# Patient Record
Sex: Female | Born: 1990 | Marital: Single | Smoking: Never smoker
Health system: Southern US, Community
[De-identification: ages and names within clinical notes are randomized; demographics above are authoritative.]

## PROBLEM LIST (undated history)

## (undated) DIAGNOSIS — H9319 Tinnitus, unspecified ear: Secondary | ICD-10-CM

## (undated) DIAGNOSIS — H539 Unspecified visual disturbance: Secondary | ICD-10-CM

## (undated) DIAGNOSIS — S060X9A Concussion with loss of consciousness of unspecified duration, initial encounter: Secondary | ICD-10-CM

## (undated) DIAGNOSIS — S060XAA Concussion with loss of consciousness status unknown, initial encounter: Secondary | ICD-10-CM

## (undated) DIAGNOSIS — R519 Headache, unspecified: Secondary | ICD-10-CM

## (undated) DIAGNOSIS — R569 Unspecified convulsions: Secondary | ICD-10-CM

## (undated) HISTORY — DX: Tinnitus, unspecified ear: H93.19

## (undated) HISTORY — DX: Concussion with loss of consciousness status unknown, initial encounter: S06.0XAA

## (undated) HISTORY — DX: Unspecified visual disturbance: H53.9

## (undated) HISTORY — DX: Unspecified convulsions: R56.9

## (undated) HISTORY — DX: Concussion with loss of consciousness of unspecified duration, initial encounter: S06.0X9A

## (undated) HISTORY — DX: Headache, unspecified: R51.9

---

## 2018-12-24 ENCOUNTER — Ambulatory Visit (HOSPITAL_COMMUNITY): Payer: Self-pay | Admitting: Psychiatry

## 2019-03-09 ENCOUNTER — Encounter: Payer: Self-pay | Admitting: Neurology

## 2019-03-09 ENCOUNTER — Ambulatory Visit: Payer: Medicaid Other | Admitting: Neurology

## 2019-03-09 ENCOUNTER — Other Ambulatory Visit: Payer: Self-pay

## 2019-03-09 VITALS — BP 119/83 | HR 96 | Temp 97.7°F | Ht 63.0 in | Wt 186.0 lb

## 2019-03-09 DIAGNOSIS — G43019 Migraine without aura, intractable, without status migrainosus: Secondary | ICD-10-CM

## 2019-03-09 MED ORDER — AIMOVIG 140 MG/ML ~~LOC~~ SOAJ: 140.0000 mg | SUBCUTANEOUS | 5 refills | Status: DC

## 2019-03-09 MED ORDER — ONDANSETRON HCL 4 MG PO TABS: 4.0000 mg | ORAL_TABLET | Freq: Three times a day (TID) | ORAL | 0 refills | Status: DC | PRN

## 2019-03-09 NOTE — Patient Instructions (Addendum)
You have tried a failed multiple preventative and acute treatments for your migraines.  You are a good candidate for one of the newer injectable medications, such as Aimovig, Ajovy and Emgality.  As discussed, we will start Aimovig 140 mg/ml, 1 inj subcutaneously every 30 days.  We can help you with your first injection and education. Please remember that Aimovig has not been approved for use during pregnancy in case you are planning to get pregnant.  You have to stop the medication in that case.   Potential side effects include, But are not limited to constipation, injection site reaction, muscle spasm or muscle cramps, please feel free to read the package insert. If need be, we can help you with your first injection once it is available, to teach you how to do the monthly injections.  For nausea, I recommend a short prescription for Zofran/ondansetron 4 mg strength as needed.  For your ringing in the ear and spinning sensation/vertigo symptoms, please follow-up with your ENT and/or talk to Raelyn Number, your primary nurse practitioner about seeing an ear nose throat specialist and/or doing physical therapy for vertigo, if you have not had it.  Please follow-up in 3 months to see one of our nurse practitioners, either [place] or [place].
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
## 2019-03-23 ENCOUNTER — Ambulatory Visit: Payer: Medicaid Other | Admitting: Adult Health

## 2019-04-13 ENCOUNTER — Ambulatory Visit: Payer: Medicaid Other | Admitting: Adult Health

## 2019-04-13 ENCOUNTER — Ambulatory Visit (INDEPENDENT_AMBULATORY_CARE_PROVIDER_SITE_OTHER): Payer: Self-pay | Admitting: Neurology

## 2019-04-13 DIAGNOSIS — G43019 Migraine without aura, intractable, without status migrainosus: Secondary | ICD-10-CM

## 2019-04-14 NOTE — Progress Notes (Signed)
Pt presented today and I educated her on how to administer her aimovig injection. We talked about making sure to keep the medication in refrigerator until ready to take. Discussed letting it sit for 30-45 min before administering. Reviewed the different locations on where to give injection. Discussed about cleaning the site and how to prepare the injection. Educated the patient on how to hold the injection pen so that it was easy to press the button and educated on making sure that the pen is pressed firmly against the skin. Patient was able to administer her personal medication into her RLQ. She was appreciative for the education. A Card was given as a reference for future injections. Informed the patient this is due monthly and she can set a reminder on her phone to make sure she gives 2nd shot 05/14/19. She verbalized understanding.

## 2019-06-01 ENCOUNTER — Telehealth: Payer: Self-pay | Admitting: Neurology

## 2019-06-01 NOTE — Telephone Encounter (Signed)
Pt states when she was here she was told she would be referred to [facility] for Vertigo therapy.  Pt states she has never heard from them and they told her they have not received anything on her for therapy.  Please call

## 2019-06-01 NOTE — Telephone Encounter (Signed)
Done

## 2019-06-01 NOTE — Telephone Encounter (Signed)
Per Dr. Teofilo Pod note: "For your ringing in the ear and spinning sensation/vertigo symptoms, please follow-up with your ENT and/or talk to Norva Riffle, your primary nurse practitioner about seeing an ear nose throat specialist and/or doing physical therapy for vertigo, if you have not had it."   I called pt and had an extended conversation with her. I reminded her to speak with her PCP and/or ENT regarding her vertigo symptoms. She is wondering why she is still nauseated at times. She doesn't think zofran helps. She has an appt on Monday and will discuss this with Aundra Millet, NP at that time.  She reports that she does not want [facility] and [facility] communicating about her medical history and that she "left [facility] for a reason." She wants to be listed as a "private" patient. She is concerned about a possible data breach. I advised her that I will ask our MR coordinator to look into this.   Pt verbalized understanding and agreement of the above plan.

## 2019-06-07 ENCOUNTER — Telehealth: Payer: Self-pay | Admitting: Adult Health

## 2019-06-07 ENCOUNTER — Encounter: Payer: Self-pay | Admitting: Adult Health

## 2019-06-07 ENCOUNTER — Ambulatory Visit: Payer: Medicaid Other | Admitting: Adult Health

## 2019-06-07 VITALS — BP 122/84 | HR 84 | Temp 97.8°F | Ht 62.0 in | Wt 188.8 lb

## 2019-06-07 DIAGNOSIS — R2689 Other abnormalities of gait and mobility: Secondary | ICD-10-CM

## 2019-06-07 DIAGNOSIS — R519 Headache, unspecified: Secondary | ICD-10-CM | POA: Diagnosis not present

## 2019-06-07 DIAGNOSIS — R42 Dizziness and giddiness: Secondary | ICD-10-CM | POA: Diagnosis not present

## 2019-06-07 NOTE — Patient Instructions (Addendum)
Your Plan:  Continue aimovig  Discuss medications with psychiatrist could be medication interaction? MRI brain Blood work and drug screen today If your symptoms worsen or you develop new symptoms please let us know.     Thank you for coming to see [place] at [facility]. I hope we have been able to provide you high quality care today.  You may receive a patient satisfaction survey over the next few weeks. We would appreciate your feedback and comments so that we may continue to improve ourselves and the health of our patients.
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
## 2019-06-07 NOTE — Telephone Encounter (Signed)
Medicaid order sent to GI. They will obtain the auth and reach out to the patient to schedule.  

## 2019-06-10 LAB — DRUG PROFILE 799016
Amphetamine GC/MS Conf: 2202 ng/mL
Amphetamine: POSITIVE — AB
Amphetamines: POSITIVE — AB
Methamphetamine: NEGATIVE

## 2019-06-10 LAB — DRUG SCREEN 764883 11+OXYCO+ALC+CRT-BUND
BENZODIAZ UR QL: NEGATIVE ng/mL
Barbiturate: NEGATIVE ng/mL
Cannabinoid Quant, Ur: NEGATIVE ng/mL
Cocaine (Metabolite): NEGATIVE ng/mL
Creatinine: 247.6 mg/dL (ref 20.0–300.0)
Ethanol: NEGATIVE %
Meperidine: NEGATIVE ng/mL
Methadone Screen, Urine: NEGATIVE ng/mL
OPIATE SCREEN URINE: NEGATIVE ng/mL
Oxycodone/Oxymorphone, Urine: NEGATIVE ng/mL
Phencyclidine: NEGATIVE ng/mL
Propoxyphene: NEGATIVE ng/mL
Tramadol: NEGATIVE ng/mL
pH, Urine: 5.8 (ref 4.5–8.9)

## 2019-06-10 LAB — ETHANOL: Ethanol: <0.01 %

## 2019-06-14 ENCOUNTER — Other Ambulatory Visit: Payer: Self-pay | Admitting: Neurology

## 2019-06-16 ENCOUNTER — Telehealth: Payer: Self-pay | Admitting: Adult Health

## 2019-06-16 NOTE — Telephone Encounter (Signed)
Drug registry check for pt. Only medication listed was 07/06/2017 1 07/04/2017 Vyvanse 30 Mg Capsule.

## 2019-06-16 NOTE — Telephone Encounter (Signed)
I called the patient.  Advised that her drug screen was positive for amphetamines.  She reports that she was given Adderall by [facility].  She reports that she is currently out of this prescription and has requested a refill.  The patient also has not scheduled her MRI.  I gave her [facility] images number to call and schedule.

## 2019-06-22 ENCOUNTER — Telehealth: Payer: Self-pay | Admitting: Adult Health

## 2019-06-22 DIAGNOSIS — R42 Dizziness and giddiness: Secondary | ICD-10-CM

## 2019-06-22 NOTE — Telephone Encounter (Signed)
Pt called wanting to get an update on her vertigo therapy referral states she has not received a call. Please follow up

## 2019-06-23 NOTE — Addendum Note (Signed)
Addended by: Guy Begin on: 06/23/2019 08:33 AM   Modules accepted: Orders

## 2019-06-29 NOTE — Telephone Encounter (Signed)
Pt states she has called [facility] and has been told they do not have the referral yet.  Pt is asking to be called @[phone] , pt states her vertigo is worsening.  Please call

## 2019-06-30 NOTE — Addendum Note (Signed)
Addended by: Enedina Finner on: 06/30/2019 11:34 AM   Modules accepted: Orders

## 2019-07-08 ENCOUNTER — Ambulatory Visit: Payer: Medicaid Other | Attending: Adult Health

## 2019-07-08 ENCOUNTER — Other Ambulatory Visit: Payer: Self-pay

## 2019-07-08 DIAGNOSIS — R2681 Unsteadiness on feet: Secondary | ICD-10-CM

## 2019-07-08 DIAGNOSIS — R42 Dizziness and giddiness: Secondary | ICD-10-CM | POA: Diagnosis not present

## 2019-07-08 DIAGNOSIS — R2689 Other abnormalities of gait and mobility: Secondary | ICD-10-CM | POA: Diagnosis present
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
## 2019-07-12 ENCOUNTER — Other Ambulatory Visit: Payer: Self-pay | Admitting: Physician Assistant

## 2019-07-12 DIAGNOSIS — R112 Nausea with vomiting, unspecified: Secondary | ICD-10-CM

## 2019-07-14 ENCOUNTER — Ambulatory Visit
Admission: RE | Admit: 2019-07-14 | Discharge: 2019-07-14 | Disposition: A | Discharge status: Home / Self Care | Payer: Medicaid Other | Source: Ambulatory Visit | Attending: Adult Health | Admitting: Adult Health

## 2019-07-14 ENCOUNTER — Other Ambulatory Visit: Payer: Self-pay

## 2019-07-14 DIAGNOSIS — R519 Headache, unspecified: Secondary | ICD-10-CM

## 2019-07-14 DIAGNOSIS — R42 Dizziness and giddiness: Secondary | ICD-10-CM

## 2019-07-14 DIAGNOSIS — R2689 Other abnormalities of gait and mobility: Secondary | ICD-10-CM

## 2019-07-14 MED ORDER — GADOBENATE DIMEGLUMINE 529 MG/ML IV SOLN
19.0000 mL | Freq: Once | INTRAVENOUS | Status: AC | PRN
  Administered 2019-07-14: 19 mL via INTRAVENOUS

## 2019-07-15 ENCOUNTER — Ambulatory Visit: Payer: Medicaid Other | Admitting: Physical Therapy

## 2019-07-15 ENCOUNTER — Telehealth: Payer: Self-pay

## 2019-07-15 NOTE — Telephone Encounter (Signed)
-----   Message from Geronimo Running, RN sent at 07/15/2019  4:02 PM EDT -----  ----- Message ----- From: Butch Penny, NP Sent: 07/15/2019   3:58 PM EDT To: Guy Begin, RN  MRI is unremarkable.

## 2019-07-15 NOTE — Telephone Encounter (Signed)
Lm on the VM for the patient to call back °

## 2019-07-20 ENCOUNTER — Ambulatory Visit: Payer: Medicaid Other | Attending: Adult Health | Admitting: Physical Therapy

## 2019-07-20 ENCOUNTER — Ambulatory Visit
Admission: RE | Admit: 2019-07-20 | Discharge: 2019-07-20 | Disposition: A | Discharge status: Home / Self Care | Payer: Medicaid Other | Source: Ambulatory Visit | Attending: Physician Assistant | Admitting: Physician Assistant

## 2019-07-20 DIAGNOSIS — R112 Nausea with vomiting, unspecified: Secondary | ICD-10-CM

## 2019-07-28 ENCOUNTER — Ambulatory Visit: Payer: Medicaid Other | Admitting: Physical Therapy

## 2019-08-02 NOTE — Telephone Encounter (Signed)
Another attempt to contact the patient was unsuccessful. Phone number was disconnected

## 2019-08-02 NOTE — Telephone Encounter (Signed)
-----   Message from Kristen Dinkins, RN sent at 07/15/2019  4:02 PM EDT -----  ----- Message ----- From: Millikan, Megan, NP Sent: 07/15/2019   3:58 PM EDT To: Sandra S Young, RN  MRI is unremarkable.  

## 2019-08-12 ENCOUNTER — Telehealth: Payer: Self-pay | Admitting: Adult Health

## 2019-08-12 DIAGNOSIS — G43019 Migraine without aura, intractable, without status migrainosus: Secondary | ICD-10-CM

## 2019-08-12 DIAGNOSIS — R2689 Other abnormalities of gait and mobility: Secondary | ICD-10-CM

## 2019-08-12 DIAGNOSIS — R42 Dizziness and giddiness: Secondary | ICD-10-CM

## 2019-08-12 NOTE — Telephone Encounter (Signed)
Pt called to advise if she could begin botox for her migraines as she is still having issues [phone]

## 2019-08-12 NOTE — Telephone Encounter (Signed)
I called and could not LVM.

## 2019-08-17 NOTE — Addendum Note (Signed)
Addended by: Hermenia Fiscal S on: 08/17/2019 11:45 AM   Modules accepted: Orders

## 2019-08-17 NOTE — Telephone Encounter (Signed)
I spoke to pt She is asking about BOTOX.  I made appt 09-21-19 at 1130.  She asked about vest rehab and I relayed that she should be able to call and setup again.  (looking at schedule she has had no shows multiple times).

## 2019-08-17 NOTE — Telephone Encounter (Signed)
I spoke to Jacqueline Bishop re: to vest rehab.  She recommend to place another order for her.  Done.

## 2019-08-20 ENCOUNTER — Telehealth: Payer: Self-pay | Admitting: Adult Health

## 2019-08-20 NOTE — Telephone Encounter (Signed)
Pt has called asking that a prior authorization request be sent to [facility] for additional office visits.  Please call

## 2019-08-27 NOTE — Telephone Encounter (Signed)
Pt called wanting to know the update on the PA. Please advise.

## 2019-08-30 ENCOUNTER — Ambulatory Visit: Payer: Medicaid Other

## 2019-08-30 NOTE — Telephone Encounter (Signed)
Patient called me and rescheduled her appointment for July per Angie in billing. I could not find any openings with Megan, so I scheduled her with Dr. Frances Furbish for 7/13 at 8:30, check in 8.

## 2019-08-30 NOTE — Telephone Encounter (Signed)
I called pt and after speaking to her about needing a PA for extending visit (surpassing # of visits in the year).  I relayed per AA in billing that this si not something they have done before.  She can get a form that can be filled out.  She states that she wanted to be transferred to billing.  I did to AA [id number].

## 2019-08-31 ENCOUNTER — Other Ambulatory Visit: Payer: Self-pay | Admitting: Physician Assistant

## 2019-08-31 DIAGNOSIS — R112 Nausea with vomiting, unspecified: Secondary | ICD-10-CM

## 2019-09-06 ENCOUNTER — Other Ambulatory Visit: Payer: Self-pay

## 2019-09-06 ENCOUNTER — Telehealth: Payer: Self-pay | Admitting: Adult Health

## 2019-09-06 ENCOUNTER — Encounter: Payer: Self-pay | Admitting: Physical Therapy

## 2019-09-06 ENCOUNTER — Ambulatory Visit: Payer: Medicaid Other | Attending: Adult Health | Admitting: Physical Therapy

## 2019-09-06 DIAGNOSIS — R42 Dizziness and giddiness: Secondary | ICD-10-CM | POA: Insufficient documentation

## 2019-09-06 DIAGNOSIS — R2681 Unsteadiness on feet: Secondary | ICD-10-CM | POA: Insufficient documentation

## 2019-09-06 DIAGNOSIS — R2689 Other abnormalities of gait and mobility: Secondary | ICD-10-CM | POA: Diagnosis present

## 2019-09-06 NOTE — Patient Instructions (Signed)
Gaze Stabilization: Sitting    Keeping eyes on target on wall \\_10N  feet away, tilt head down 15-30 and move head side to side for _30___ seconds. Repeat while moving head up and down for _30___ seconds. Do _3___ sessions per day.    Gaze Stabilization: Tip Card  1.Target must remain in focus, not blurry, and appear stationary while head is in motion. 2.Perform exercises with small head movements (45 to either side of midline). 3.Increase speed of head motion so long as target is in focus. 4.If you wear eyeglasses, be sure you can see target through lens (therapist will give specific instructions for bifocal / progressive lenses). 5.These exercises may provoke dizziness or nausea. Work through these symptoms. If too dizzy, slow head movement slightly. Rest between each exercise. 6.Exercises demand concentration; avoid distractions. 7.For safety, perform standing exercises close to a counter, wall, corner, or next to someone.  Copyright  [employer]. All rights reserved.

## 2019-09-06 NOTE — Telephone Encounter (Signed)
Pt is requesting call back from nurse regarding questions about a new migraine medication. She has a follow-up with Dr. Frances Furbish on 07/13 but is wanting to see if she can be given something before then. Please advise at [phone].

## 2019-09-06 NOTE — Telephone Encounter (Signed)
Called and VM not set up

## 2019-09-07 NOTE — Therapy (Signed)
[facility] [address] [place], [place], [address] Phone: [phone]   Fax:  [phone]  Physical Therapy Evaluation  Patient Details  Name: Jacqueline Bishop MRN: [id number] Date of Birth: 07/17/1990 Referring Provider (PT): Butch Penny, NP   Encounter Date: 09/06/2019   PT End of Session - 09/07/19 2058    Visit Number 1    Number of Visits 6    Authorization Type CCME    PT Start Time 0932    PT Stop Time 1016    PT Time Calculation (min) 44 min    Activity Tolerance Patient limited by pain;Other (comment)   limited by dizziness   Behavior During Therapy Restless;Anxious           Past Medical History:  Diagnosis Date  . Concussion   . Headache    migraines  . Seizures (HCC)    as child  . Tinnitus   . Vision abnormalities    blurred vision r/t migraines    History reviewed. No pertinent surgical history.  There were no vitals filed for this visit.        [facility] PT Assessment - 09/07/19 0001      Assessment   Medical Diagnosis Dizziness    Referring Provider (PT) Butch Penny, NP    Onset Date/Surgical Date 06/30/19   referral date but dizziness began a few years ago   Hand Dominance Right    Prior Therapy none for dizziness      Precautions   Precautions Fall      Restrictions   Weight Bearing Restrictions No      Balance Screen   Has the patient fallen in the past 6 months Yes    How many times? 30+    Has the patient had a decrease in activity level because of a fear of falling?  Yes    Is the patient reluctant to leave their home because of a fear of falling?  Yes      Home Environment   Living Environment Private residence    Living Arrangements Parent   mom   Available Help at Discharge Family;Available PRN/intermittently    Type of Home House    Home Access Stairs to enter    Entrance Stairs-Number of Steps 3    Entrance Stairs-Rails Left    Home Layout One level     Home Equipment Crutches      Prior Function   Level of Independence Independent    Vocation On disability    Leisure Watch movies, read, look at stars,       Cognition   Overall Cognitive Status Within Functional Limits for tasks assessed   pt reports issues with memory     Ambulation/Gait   Ambulation/Gait Yes    Ambulation/Gait Assistance 5: Supervision    Ambulation Distance (Feet) 100 Feet    Assistive device None    Gait Pattern Step-through pattern;Decreased stride length;Decreased arm swing - right;Decreased arm swing - left;Narrow base of support   intermittent narrow BOS                 Vestibular Assessment - 09/07/19 0001      Vestibular Assessment   General Observation dizziness rates dizziness 10/10 at current time; best rating 7/10 "on a good day"       Symptom Behavior   Type of Dizziness  Spinning;Imbalance;Diplopia;Unsteady with head/body turns;Lightheadedness;"Funny feeling in head"    Frequency of Dizziness Daily  Duration of Dizziness Constant    Symptom Nature Motion provoked    Aggravating Factors Looking up to the ceiling;Turning body quickly;Turning head quickly;Turning head sideways;Rolling to left;Rolling to right    Relieving Factors Rest;Slow movements    Progression of Symptoms Worse      Oculomotor Exam   Oculomotor Alignment Abnormal   Rt eye is not as open as her Lt eye   Spontaneous Absent    Gaze-induced  Absent    Head shaking Horizontal Comment   unable to attempt stating "it hurts"   Head Shaking Vertical Comment   unable to attempt "it hurts"   Smooth Pursuits Comment   unable to assess as pt unable to perform/tolerate testing   Saccades Undershoots   c/o nausea    Comment Pt reported dizziness and nausea with oculomotor exam - unable to complete testing      Vestibulo-Ocular Reflex   VOR 1 Head Only (x 1 viewing) Pt unable to perform 2/2 dizziness and stated she was nauseated. Close eyes right away during VOR.       Visual  Acuity   Static unable to tolerate testing    Dynamic unable to tolerate testing               Objective measurements completed on examination: See above findings.               PT Education - 09/07/19 0603    Education Details x1 viewing exercise given for HEP    Person(s) Educated Patient    Methods Explanation;Demonstration;Handout    Comprehension Verbalized understanding;Returned demonstration            PT Short Term Goals - 09/07/19 2045      PT SHORT TERM GOAL #1   Title Pt will be IND with initial HEP to improve dizziness and balance.    Baseline No HEP issued due to eval only    Time 3    Period Weeks    Status New    Target Date 10/01/19      PT SHORT TERM GOAL #2   Title Pt will amb. 30' with horizontal head turns with c/o dizziness </= 6/10 intensity.    Baseline unable to perform head turns during gait due to c/o dizziness    Time 3    Period Weeks    Status New    Target Date 10/01/19      PT SHORT TERM GOAL #3   Title Pt will report dizziness at worst is </=7/10 to safely perform functional mobility.    Baseline 10/10 at worst    Time 3    Period Weeks    Status New    Target Date 10/01/19             PT Long Term Goals - 09/07/19 2050      PT LONG TERM GOAL #1   Title Pt will report dizziness at worst is </=5/10 to safely perform functional mobility and increase safety with ADL's. (target date for all LTG's AFTER 6 visits)    Baseline 10/10 at worst    Time 6    Period Weeks    Status New    Target Date 10/22/19      PT LONG TERM GOAL #2   Title Pt will tolerate x1 viewing for at least 30 secs to improve VOR for gaze stabilization.    Baseline unable to tolerate this exercise due to c/o dizziness    Time 6  Period Weeks    Status New    Target Date 10/22/19      PT LONG TERM GOAL #3   Title Pt will be IND with updated HEP to improve dizziness & balance.    Baseline No HEP due to eval only    Time 6    Period  Weeks    Status New    Target Date 10/22/19                  Plan - 09/07/19 2028    Clinical Impression Statement Pt is a 29 yr old female presenting to OP PT with dizziness>  Pt's PMH is significant for the following: hx of concussion, migraines, bipolar disorder, seizures as a child and tinnitus.  Ocolomotor exam was unable to be accurately assessed as pt closed her eyes during testing.  Pt presents with c/o dizziness, photophobia, impaired balance and abnormal VOR.  Pt will benefit from PT to address dizziness and imbalance.    Personal Factors and Comorbidities Behavior Pattern;Comorbidity 3+;Transportation;Time since onset of injury/illness/exacerbation;Fitness    Comorbidities Hx of migraines, bipolar disorder, seizures as a child, obesity, tinnitus    Examination-Activity Limitations Bathing;Bed Mobility;Bend;Locomotion Level;Transfers;Reach Overhead;Carry;Squat;Dressing;Stand    Examination-Participation Restrictions Cleaning;Meal Prep;Driving;Laundry    Stability/Clinical Decision Making Evolving/Moderate complexity    Rehab Potential Good    PT Frequency 1x / week    PT Duration 3 weeks   and then 1x/week for 3 additional visits   PT Treatment/Interventions ADLs/Self Care Home Management;Canalith Repostioning;Biofeedback;DME Instruction;Neuromuscular re-education;Balance training;Therapeutic exercise;Therapeutic activities;Functional mobility training;Stair training;Gait training;Patient/family education;Vestibular    PT Next Visit Plan check x1 viewing exercise; issue balance on foam    Consulted and Agree with Plan of Care Patient           Patient will benefit from skilled therapeutic intervention in order to improve the following deficits and impairments:  Abnormal gait, Decreased endurance, Decreased balance, Dizziness, Decreased strength, Decreased mobility, Impaired sensation  Visit Diagnosis: Dizziness and giddiness - Plan: PT plan of care  cert/re-cert  Unsteadiness on feet - Plan: PT plan of care cert/re-cert  Other abnormalities of gait and mobility - Plan: PT plan of care cert/re-cert     Problem List There are no problems to display for this patient.   Kary Kos, PT 09/07/2019, 9:01 PM  [facility] [address] [place], [place], [address] Phone: [phone]   Fax:  [phone]  Name: Jacqueline Bishop MRN: [id number] Date of Birth: 1991/03/07

## 2019-09-07 NOTE — Telephone Encounter (Signed)
Called and vm not set up yet.

## 2019-09-07 NOTE — Telephone Encounter (Signed)
She should wait for her appointment with Dr. Frances Furbish to discuss

## 2019-09-08 ENCOUNTER — Ambulatory Visit: Payer: Medicaid Other | Admitting: Adult Health

## 2019-09-16 ENCOUNTER — Ambulatory Visit
Admission: RE | Admit: 2019-09-16 | Discharge: 2019-09-16 | Disposition: A | Discharge status: Home / Self Care | Payer: Medicaid Other | Source: Ambulatory Visit | Attending: Physician Assistant | Admitting: Physician Assistant

## 2019-09-16 DIAGNOSIS — R112 Nausea with vomiting, unspecified: Secondary | ICD-10-CM

## 2019-09-21 ENCOUNTER — Ambulatory Visit: Payer: Medicaid Other | Admitting: Adult Health

## 2019-09-28 ENCOUNTER — Ambulatory Visit: Payer: Medicaid Other | Admitting: Neurology

## 2019-09-28 ENCOUNTER — Encounter: Payer: Self-pay | Admitting: Neurology

## 2019-09-28 VITALS — BP 126/82 | HR 76 | Ht 62.5 in | Wt 200.1 lb

## 2019-09-28 DIAGNOSIS — H9319 Tinnitus, unspecified ear: Secondary | ICD-10-CM

## 2019-09-28 DIAGNOSIS — G43019 Migraine without aura, intractable, without status migrainosus: Secondary | ICD-10-CM

## 2019-09-28 DIAGNOSIS — R42 Dizziness and giddiness: Secondary | ICD-10-CM

## 2019-09-28 DIAGNOSIS — R2689 Other abnormalities of gait and mobility: Secondary | ICD-10-CM

## 2019-09-28 DIAGNOSIS — R112 Nausea with vomiting, unspecified: Secondary | ICD-10-CM

## 2019-09-28 DIAGNOSIS — R519 Headache, unspecified: Secondary | ICD-10-CM | POA: Diagnosis not present

## 2019-09-28 MED ORDER — AJOVY 225 MG/1.5ML ~~LOC~~ SOAJ: 225.0000 mg | SUBCUTANEOUS | 5 refills | Status: DC

## 2019-09-28 MED ORDER — UBRELVY 50 MG PO TABS: 50.0000 mg | ORAL_TABLET | ORAL | 3 refills | Status: DC | PRN
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
## 2019-09-28 NOTE — Patient Instructions (Addendum)
You have tried a failed multiple abortive and preventative treatments for your migraines.  Unfortunately, the Aimovig injection has not worked well for you.  him.  I believe you may have better results with Ajovy 225 mg/1.5 ml, it is also 1 injection subcutaneously every 30 days.  Potential side effects include: Palpitations as an fast heartbeat, hives, itching, rash, hoarseness, irritation at the injection site, joint pain and stiffness or joint swelling, swelling of the eyelids, face, lips, hands or feet, chest tightness, trouble breathing or swallowing.  Some side effects are mild and go away as your body gets used to the new medication.  If you have any serious side effects such as bleeding or blistering or discoloration of your skin, hives, significant itching or a rash, please seek immediate medical attention by going to the ER or calling 911.  Injection site reactions include itching, redness, pain at the injection site and Rare side effects include: Anaphylaxis (a severe allergic reaction), angioedema (severe swelling including around mouth and tongue).   Start Ubrelvy, 50 mg strength: Take 1 pill at onset of migraine headache, may repeat in 2 hours, no more than 4 pills in 24 hours, i.e. not to exceed 200 mg per 24 hours. May cause sedation and nausea.   Please talk to your ENT specialist about ongoing issues with your vertigo and ringing in the ears, tinnitus.  Please also keep your appointment with physical therapy for vestibular rehab.  Thankfully, your brain MRI was normal.  Please also talk to your GI doctor about your ongoing nausea and vomiting and constipation.  Follow-up with our nurse practitioner in 3 to 4 months.  Feel free to call us or email [place] for any interim questions or concerns.

## 2019-10-04 ENCOUNTER — Ambulatory Visit: Payer: Medicaid Other | Attending: Adult Health | Admitting: Physical Therapy

## 2019-10-04 ENCOUNTER — Other Ambulatory Visit: Payer: Self-pay

## 2019-10-04 ENCOUNTER — Encounter: Payer: Self-pay | Admitting: Physical Therapy

## 2019-10-04 DIAGNOSIS — R2681 Unsteadiness on feet: Secondary | ICD-10-CM | POA: Diagnosis present

## 2019-10-04 DIAGNOSIS — R2689 Other abnormalities of gait and mobility: Secondary | ICD-10-CM | POA: Diagnosis present

## 2019-10-04 DIAGNOSIS — R42 Dizziness and giddiness: Secondary | ICD-10-CM | POA: Diagnosis not present

## 2019-10-04 NOTE — Patient Instructions (Signed)
Access Code: [email or web address] URL: [email or web address] Date: 10/04/2019 Prepared by: Sallyanne Kuster  Exercises Standing Balance in Corner with Eyes Closed - 1 x daily - 5 x weekly - 1 sets - 3 reps - 15 hold Wide Tandem Stance with Eyes Open - 1 x daily - 5 x weekly - 1 sets - 3 reps - 10 hold
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
## 2019-10-11 ENCOUNTER — Ambulatory Visit: Payer: Medicaid Other | Admitting: Physical Therapy

## 2019-10-18 ENCOUNTER — Other Ambulatory Visit: Payer: Self-pay

## 2019-10-18 ENCOUNTER — Encounter: Payer: Self-pay | Admitting: Physical Therapy

## 2019-10-18 ENCOUNTER — Ambulatory Visit: Payer: Medicaid Other | Attending: Adult Health | Admitting: Physical Therapy

## 2019-10-18 DIAGNOSIS — R2681 Unsteadiness on feet: Secondary | ICD-10-CM | POA: Insufficient documentation

## 2019-10-18 DIAGNOSIS — R2689 Other abnormalities of gait and mobility: Secondary | ICD-10-CM | POA: Insufficient documentation

## 2019-10-18 DIAGNOSIS — R42 Dizziness and giddiness: Secondary | ICD-10-CM | POA: Diagnosis not present

## 2019-10-18 NOTE — Therapy (Signed)
[facility] [address] [place], [place], [address] Phone: [phone]   Fax:  [phone]  Physical Therapy Treatment  Patient Details  Name: Jacqueline Bishop MRN: [id number] Date of Birth: [birth year] Referring Provider (PT): Ward Givens, NP   Encounter Date: 10/18/2019   PT End of Session - 10/18/19 0941    Visit Number 3    Number of Visits 6    Authorization Type CCME    Authorization Time Period 3 visits from 10/04/19- 10/24/19    Authorization - Visit Number 2    Authorization - Number of Visits 3    Progress Note Due on Visit 3    PT Start Time 0935    PT Stop Time 1015    PT Time Calculation (min) 40 min    Activity Tolerance Patient limited by pain;Other (comment)   limited by dizziness   Behavior During Therapy Restless;Anxious           Past Medical History:  Diagnosis Date  . Concussion   . Headache    migraines  . Seizures ([place])    as child  . Tinnitus   . Vision abnormalities    blurred vision r/t migraines    History reviewed. No pertinent surgical history.  There were no vitals filed for this visit.   Subjective Assessment - 10/18/19 0935    Subjective No new complaints. Reports the medication changes are helping "still dizzy and woozy at times". Reports "about 7" falls since last visit 2 weeks ago. Reports most falls occur when she bends over, gets dizzy or nauseaed. Reports vomiting some with these falls due to nausea.    Pertinent History Hx of migraines, bipolar disorder, seizures as a child, obesity, tinnitus    Diagnostic tests MRI - was done 07-14-19 (results normal)    Patient Stated Goals "reduce the vertigo and get better"    Currently in Pain? Yes    Pain Score 10-Worst pain ever    Pain Location Head    Pain Orientation Right;Left    Pain Descriptors / Indicators Headache    Pain Type Chronic pain    Pain Onset More than a month ago    Pain Frequency Constant    Aggravating  Factors  standing up quickly, rushing or hurrying,    Pain Relieving Factors medication which has not helped                  [facility] Adult PT Treatment/Exercise - 10/18/19 0943      Transfers   Transfers Sit to Stand;Stand to Sit    Sit to Stand 5: Supervision;With upper extremity assist;From chair/3-in-1    Stand to Sit 5: Supervision;Without upper extremity assist;To chair/3-in-1      Ambulation/Gait   Ambulation/Gait Yes    Ambulation/Gait Assistance 5: Supervision;4: Min guard    Ambulation/Gait Assistance Details min guard assist with gait into/out of gym with no AD. lateral veering/staggering noted at times. use of rollator in session to assess safety/balance with use. decreased veering/staggering noted. cues needed to keep increased base of support as pt scissored with every step.     Ambulation Distance (Feet) 115 Feet   x1, 230 x1   Assistive device None;Rollator    Gait Pattern Step-through pattern;Decreased stride length;Decreased arm swing - right;Decreased arm swing - left;Narrow base of support    Ambulation Surface Level;Indoor      Self-Care   Self-Care Other Self-Care Comments    Other Self-Care Comments  discussed use of migraine glasses for in stores and looking at screens to decrease sensitivity.       Exercises   Exercises Other Exercises    Other Exercises  verbally discussed HEP. Not doing them consistently. Stopped doing the x1 exercises because they made her dizzy. discussed this is to be expected, however the more she does them the less dizzy she should get over time. Also reports "sitting down on floor" with corner ex's at times. discussed doing the with her legs propped against the bed with a chair in front for safety so she sits on the bed instead of the floor. Pt to try this at home.                    PT Short Term Goals - 10/18/19 0947      PT SHORT TERM GOAL #1   Title Pt will be IND with initial HEP to improve dizziness and balance.     Baseline 10/18/19: has program. Not consistent with performance due to exacerbating her symptoms a times.    Status Partially Met    Target Date --      PT SHORT TERM GOAL #2   Title Pt will amb. 30' with horizontal head turns with c/o dizziness </= 6/10 intensity.    Baseline 10/18/19: tested with rollator today. Pt able to scan around gym, however dizziness remainded at a 10/10.    Time --    Period --    Status Partially Met    Target Date --      PT SHORT TERM GOAL #3   Title Pt will report dizziness at worst is </=7/10 to safely perform functional mobility.    Baseline 10/18/19: continues to report 10/10 dizziness with headache    Status Not Met    Target Date --             PT Long Term Goals - 09/07/19 2050      PT LONG TERM GOAL #1   Title Pt will report dizziness at worst is </=5/10 to safely perform functional mobility and increase safety with ADL's. (target date for all LTG's AFTER 6 visits)    Baseline 10/10 at worst    Time 6    Period Weeks    Status New    Target Date 10/22/19      PT LONG TERM GOAL #2   Title Pt will tolerate x1 viewing for at least 30 secs to improve VOR for gaze stabilization.    Baseline unable to tolerate this exercise due to c/o dizziness    Time 6    Period Weeks    Status New    Target Date 10/22/19      PT LONG TERM GOAL #3   Title Pt will be IND with updated HEP to improve dizziness & balance.    Baseline No HEP due to eval only    Time 6    Period Weeks    Status New    Target Date 10/22/19                 Plan - 10/18/19 0942    Clinical Impression Statement Today's skilled session focued on progress toward STGs with dizziness goal not met as pt continues to report symptoms as 10/10. Of note, pt was able to keep eyes open for entire session today and demonstrated more upright posture in sitting. Initiated gait with rollator for improved safety with mobility and decreased fall  risk. Min guard assist needed with cues to  keep feet from crossing/scissoring with swing phase of gait. No other balance issues noted, with some standing rest breaks during gait due to dizziness/headaches. Pt was able to scan gym with rollator with no increase in symptoms, of note symptoms were 10/10 to start with. Discussued of migraine glasses to assist with decreased symptoms/falls when out in community. Pt provided with information on several styles of differnent price ranges. The pt should benefit from continued PT to progress toward goals as medical status allows.    Personal Factors and Comorbidities Behavior Pattern;Comorbidity 3+;Transportation;Time since onset of injury/illness/exacerbation;Fitness    Comorbidities Hx of migraines, bipolar disorder, seizures as a child, obesity, tinnitus    Examination-Activity Limitations Bathing;Bed Mobility;Bend;Locomotion Level;Transfers;Reach Overhead;Carry;Squat;Dressing;Stand    Examination-Participation Restrictions Cleaning;Meal Prep;Driving;Laundry    Stability/Clinical Decision Making Evolving/Moderate complexity    Rehab Potential Good    PT Frequency 1x / week    PT Duration 3 weeks   and then 1x/week for 3 additional visits   PT Treatment/Interventions ADLs/Self Care Home Management;Canalith Repostioning;Biofeedback;DME Instruction;Neuromuscular re-education;Balance training;Therapeutic exercise;Therapeutic activities;Functional mobility training;Stair training;Gait training;Patient/family education;Vestibular    PT Next Visit Plan continue to work on vestibular and balance ex's as pt is able to tolerate    Consulted and Agree with Plan of Care Patient           Patient will benefit from skilled therapeutic intervention in order to improve the following deficits and impairments:  Abnormal gait, Decreased endurance, Decreased balance, Dizziness, Decreased strength, Decreased mobility, Impaired sensation  Visit Diagnosis: Dizziness and giddiness  Unsteadiness on feet  Other  abnormalities of gait and mobility     Problem List There are no problems to display for this patient.   Willow Ora, PTA, [facility] [address] [place], [place] [address] [phone] 10/18/19, 12:14 PM   Name: Jacqueline Bishop MRN: [id number] Date of Birth: 1990-05-21

## 2019-10-19 ENCOUNTER — Other Ambulatory Visit: Payer: Self-pay | Admitting: Physician Assistant

## 2019-10-20 ENCOUNTER — Other Ambulatory Visit: Payer: Self-pay | Admitting: Physician Assistant

## 2019-10-20 DIAGNOSIS — N644 Mastodynia: Secondary | ICD-10-CM

## 2019-11-04 ENCOUNTER — Telehealth: Payer: Self-pay | Admitting: Neurology

## 2019-11-04 NOTE — Telephone Encounter (Signed)
I have not ordered a walker. Please advise the patient.

## 2019-11-04 NOTE — Telephone Encounter (Signed)
Pt has requested that the request from her primary Dr for the walker with the seat be sent over to [facility](next door).  If there are questions on this request pt is asking to be called.

## 2019-11-08 NOTE — Telephone Encounter (Signed)
I called the pt and advised via vm we would not be prescribing the walker, but she could check with her primary care physician on this request.

## 2019-11-17 ENCOUNTER — Ambulatory Visit: Payer: Medicaid Other | Attending: Adult Health | Admitting: Physical Therapy

## 2019-11-17 ENCOUNTER — Other Ambulatory Visit: Payer: Self-pay

## 2019-11-17 ENCOUNTER — Telehealth: Payer: Self-pay | Admitting: Physical Therapy

## 2019-11-17 DIAGNOSIS — R2681 Unsteadiness on feet: Secondary | ICD-10-CM | POA: Diagnosis present

## 2019-11-17 DIAGNOSIS — R2689 Other abnormalities of gait and mobility: Secondary | ICD-10-CM | POA: Diagnosis present

## 2019-11-17 DIAGNOSIS — R42 Dizziness and giddiness: Secondary | ICD-10-CM | POA: Diagnosis not present

## 2019-11-17 NOTE — Telephone Encounter (Signed)
Thank you :)

## 2019-11-17 NOTE — Telephone Encounter (Signed)
Order placed

## 2019-11-17 NOTE — Telephone Encounter (Signed)
Jacqueline Penny, Jacqueline Bishop,  Jacqueline Bishop is being treated by physical therapy for dizziness and unsteady gait.  She is interested in using a rollator for walking for increase stability and to decrease her falls. We have used one in session with improvement noted. She was planning to reach out to you on this matter. If she has not and you agree with this request  please submit request in EPIC under MD Order, Other Orders (list waker with 4 wheels and seat) or fax to [facility] at [phone].   Thank you,   Sallyanne Kuster, PTA, [facility] [address], [address] [place], [place] [address] [phone] 11/17/19, 8:29 AM   [facility] [address] [place], [place]  [address] Phone:  [phone] Fax:  [phone]

## 2019-11-18 ENCOUNTER — Encounter: Payer: Self-pay | Admitting: Physical Therapy

## 2019-11-18 NOTE — Telephone Encounter (Signed)
This has been taking care of earlier this week.  The physical therapist did send a message to me requesting a rolling walker.

## 2019-11-18 NOTE — Telephone Encounter (Signed)
Pt advised we have been in touch with the physical therapist in regards to this request.

## 2019-11-18 NOTE — Therapy (Signed)
[facility] [address] [place], [place], [address] Phone: [phone]   Fax:  [phone]  Physical Therapy Treatment  Patient Details  Name: Jacqueline Bishop MRN: [id number] Date of Birth: 08/19/1990 Referring Provider (PT): Ward Givens, NP   Encounter Date: 11/17/2019   PT End of Session - 11/18/19 1340    Visit Number 4    Number of Visits 6    Authorization Type CCME    Authorization Time Period 3 visits from 10/04/19- 10/24/19; 3 visits 11-15-19 - 12-03-19    Authorization - Visit Number 1    Authorization - Number of Visits 3    Progress Note Due on Visit 3    PT Start Time 0950    PT Stop Time 1015    PT Time Calculation (min) 25 min    Activity Tolerance Patient tolerated treatment well   limited by dizziness   Behavior During Therapy Anxious           Past Medical History:  Diagnosis Date  . Concussion   . Headache    migraines  . Seizures ([place])    as child  . Tinnitus   . Vision abnormalities    blurred vision r/t migraines    History reviewed. No pertinent surgical history.  There were no vitals filed for this visit.   Subjective Assessment - 11/17/19 0959    Subjective Pt inquires about order for a rollator; pt states she really needs it - went to [employer] and fell in the store (did not get hurt)    Pertinent History Hx of migraines, bipolar disorder, seizures as a child, obesity, tinnitus    Diagnostic tests MRI - was done 07-14-19 (results normal)    Patient Stated Goals "reduce the vertigo and get better"    Currently in Pain? Yes    Pain Score 10-Worst pain ever    Pain Location Head    Pain Orientation Right;Left    Pain Descriptors / Indicators Headache    Pain Type Chronic pain    Pain Onset More than a month ago    Pain Frequency Intermittent                             [facility] Adult PT Treatment/Exercise - 11/18/19 0001      Ambulation/Gait   Ambulation/Gait  Yes    Ambulation/Gait Assistance 5: Supervision    Ambulation/Gait Assistance Details cues for locking brakes prior to sitting down for safety    Ambulation Distance (Feet) 230 Feet    Assistive device Rollator    Gait Pattern Step-through pattern    Ambulation Surface Level;Indoor               Balance Exercises - 11/18/19 0001      Balance Exercises: Standing   Standing Eyes Opened Wide (BOA);Head turns;Foam/compliant surface;5 reps    Standing Eyes Closed Head turns;Foam/compliant surface;Wide (BOA);5 reps    Marching Foam/compliant surface;Head turns;Static;5 reps    Other Standing Exercises standing on foam - turning and touching each wall 5 reps straight across; then diagonally 5 reps each side "X" pattern      Other Standing Exercises Comments standing on 2 pillows in corner - squats with EO 5 reps;  squats with EC 5 reps with CGA                PT Short Term Goals - 11/18/19 1349  PT SHORT TERM GOAL #1   Title Pt will be IND with initial HEP to improve dizziness and balance.    Baseline 10/18/19: has program. Not consistent with performance due to exacerbating her symptoms a times.    Status Partially Met      PT SHORT TERM GOAL #2   Title Pt will amb. 30' with horizontal head turns with c/o dizziness </= 6/10 intensity.    Baseline 10/18/19: tested with rollator today. Pt able to scan around gym, however dizziness remainded at a 10/10.    Status Partially Met      PT SHORT TERM GOAL #3   Title Pt will report dizziness at worst is </=7/10 to safely perform functional mobility.    Baseline 10/18/19: continues to report 10/10 dizziness with headache    Status Not Met             PT Long Term Goals - 11/18/19 1349      PT LONG TERM GOAL #1   Title Pt will report dizziness at worst is </=5/10 to safely perform functional mobility and increase safety with ADL's. (target date for all LTG's AFTER 6 visits)    Baseline 10/10 at worst    Time 6    Period Weeks     Status New      PT LONG TERM GOAL #2   Title Pt will tolerate x1 viewing for at least 30 secs to improve VOR for gaze stabilization.    Baseline unable to tolerate this exercise due to c/o dizziness    Time 6    Period Weeks    Status New      PT LONG TERM GOAL #3   Title Pt will be IND with updated HEP to improve dizziness & balance.    Baseline No HEP due to eval only    Time 6    Period Weeks    Status New                 Plan - 11/18/19 1344    Clinical Impression Statement Pt arrived at wrong time for appt session so pt was seen for a shorter session by primary PT who had opening at time of pt's arrival.  Pt inquired about and requested order for a rollator, reporting a recent fall in the grocery store due to [employer].  Pt was informed that order had been sent to MD and we would follow up on this.  Pt able to participate in balance exercises with frequent short rest periods and keep eyes open during entire session.  Plan to continue to address gait and balance deficits in remaining 2 authorized sessions.    Personal Factors and Comorbidities Behavior Pattern;Comorbidity 3+;Transportation;Time since onset of injury/illness/exacerbation;Fitness    Comorbidities Hx of migraines, bipolar disorder, seizures as a child, obesity, tinnitus    Examination-Activity Limitations Bathing;Bed Mobility;Bend;Locomotion Level;Transfers;Reach Overhead;Carry;Squat;Dressing;Stand    Examination-Participation Restrictions Cleaning;Meal Prep;Driving;Laundry    Stability/Clinical Decision Making Evolving/Moderate complexity    Rehab Potential Good    PT Frequency 1x / week    PT Duration 3 weeks   and then 1x/week for 3 additional visits   PT Treatment/Interventions ADLs/Self Care Home Management;Canalith Repostioning;Biofeedback;DME Instruction;Neuromuscular re-education;Balance training;Therapeutic exercise;Therapeutic activities;Functional mobility training;Stair training;Gait  training;Patient/family education;Vestibular    PT Next Visit Plan continue to work on vestibular and balance ex's as pt is able to tolerate - order for rollator received 11-17-19    Consulted and Agree with Plan of Care Patient  Patient will benefit from skilled therapeutic intervention in order to improve the following deficits and impairments:  Abnormal gait, Decreased endurance, Decreased balance, Dizziness, Decreased strength, Decreased mobility, Impaired sensation  Visit Diagnosis: Dizziness and giddiness  Unsteadiness on feet  Other abnormalities of gait and mobility     Problem List There are no problems to display for this patient.   Alda Lea, PT 11/18/2019, 1:51 PM  [facility] [address] [place], [place], [address] Phone: [phone]   Fax:  [phone]  Name: Makinzie Considine MRN: [id number] Date of Birth: [birth year]

## 2019-11-18 NOTE — Telephone Encounter (Signed)
Pt called primary care said neurologist would need to write the prescription for the walker, because condition is neurology related. Pt said would need walker with a seat.

## 2019-11-18 NOTE — Telephone Encounter (Signed)
Jacqueline Bishop:  Can you review the request for walker.  You had made a referral to physical therapy, I am not quite sure why the patient needs a walker.  Please reach out to her physical therapist as well if you need more information and then prescribe a walker if you deem appropriate.

## 2019-11-24 ENCOUNTER — Encounter: Payer: Self-pay | Admitting: Physical Therapy

## 2019-11-24 ENCOUNTER — Other Ambulatory Visit: Payer: Self-pay

## 2019-11-24 ENCOUNTER — Ambulatory Visit: Payer: Medicaid Other | Admitting: Physical Therapy

## 2019-11-24 DIAGNOSIS — R42 Dizziness and giddiness: Secondary | ICD-10-CM | POA: Diagnosis not present

## 2019-11-24 DIAGNOSIS — R2689 Other abnormalities of gait and mobility: Secondary | ICD-10-CM

## 2019-11-24 DIAGNOSIS — R2681 Unsteadiness on feet: Secondary | ICD-10-CM

## 2019-11-25 NOTE — Therapy (Addendum)
[facility] [address] [place], [place], [address] Phone: [phone]   Fax:  [phone]  Physical Therapy Treatment  Patient Details  Name: Jacqueline Bishop MRN: [id number] Date of Birth: March 09, 1991 Referring Provider (PT): Ward Givens, NP   Encounter Date: 11/24/2019   PT End of Session - 11/24/19 1537    Visit Number 5    Number of Visits 6    Authorization Type CCME    Authorization Time Period 3 visits from 10/04/19- 10/24/19; 3 visits 11-15-19 - 12-03-19    Authorization - Visit Number 2    Authorization - Number of Visits 3    Progress Note Due on Visit 3    PT Start Time 0086    PT Stop Time 1618    PT Time Calculation (min) 45 min    Equipment Utilized During Treatment Gait belt    Activity Tolerance Patient tolerated treatment well   limited by dizziness   Behavior During Therapy Anxious           Past Medical History:  Diagnosis Date  . Concussion   . Headache    migraines  . Seizures ([place])    as child  . Tinnitus   . Vision abnormalities    blurred vision r/t migraines    History reviewed. No pertinent surgical history.  There were no vitals filed for this visit.   Subjective Assessment - 11/24/19 1535    Subjective Reports today is a good today with "mild" dizziness and headaches. Reports 2 falls since last session, indoors. Unable to recall where. "I get really dizzy with a headache and I fall down".    Pertinent History Hx of migraines, bipolar disorder, seizures as a child, obesity, tinnitus    Diagnostic tests MRI - was done 07-14-19 (results normal)    Patient Stated Goals "reduce the vertigo and get better"    Currently in Pain? Yes    Pain Score 7     Pain Location Head    Pain Orientation Right;Left    Pain Descriptors / Indicators Headache    Pain Type Chronic pain    Pain Radiating Towards radiaties to both sides of the neck    Pain Onset More than a month ago    Pain Frequency  Intermittent    Aggravating Factors  moving quickly,rushing, hurrying    Pain Relieving Factors has medication, massage                  [facility] Adult PT Treatment/Exercise - 11/24/19 1539      Transfers   Transfers Sit to Stand    Sit to Stand 5: Supervision;With upper extremity assist;From chair/3-in-1    Stand to Sit 5: Supervision;Without upper extremity assist;To chair/3-in-1      Ambulation/Gait   Ambulation/Gait Yes    Ambulation/Gait Assistance 5: Supervision;4: Min guard    Ambulation/Gait Assistance Details cues for hand placement on brakes for safety. cues to stay closer to rollator with gait as well.     Ambulation Distance (Feet) 400 Feet    Assistive device Rollator    Gait Pattern Step-through pattern    Ambulation Surface Level;Unlevel;Indoor;Outdoor;Paved;Grass      Neuro Re-ed    Neuro Re-ed Details  for balance/habituation- picking up 4 cones form floor, then placing them back down with short rest breaks between each move to allow increased symptoms to resolve; standing with ball in hands next to mat table- tracking the ball with the following movements  for 5 reps each- laterally, horizontally, circles both ways. rest breaks between each movement to allow symptoms to resolve. min guard assist for balance.                Balance Exercises - 11/24/19 1555      Balance Exercises: Standing   Standing Eyes Opened --    Standing Eyes Closed Wide (BOA);Foam/compliant surface;Other reps (comment);Limitations;20 secs;Head turns    Standing Eyes Closed Limitations on double pillows with no UE support, occasional touch to sturdy surface for balance assistance- EC 20 sec's x3 reps,                PT Short Term Goals - 11/18/19 1349      PT SHORT TERM GOAL #1   Title Pt will be IND with initial HEP to improve dizziness and balance.    Baseline 10/18/19: has program. Not consistent with performance due to exacerbating her symptoms a times.    Status  Partially Met      PT SHORT TERM GOAL #2   Title Pt will amb. 30' with horizontal head turns with c/o dizziness </= 6/10 intensity.    Baseline 10/18/19: tested with rollator today. Pt able to scan around gym, however dizziness remainded at a 10/10.    Status Partially Met      PT SHORT TERM GOAL #3   Title Pt will report dizziness at worst is </=7/10 to safely perform functional mobility.    Baseline 10/18/19: continues to report 10/10 dizziness with headache    Status Not Met             PT Long Term Goals - 11/18/19 1349      PT LONG TERM GOAL #1   Title Pt will report dizziness at worst is </=5/10 to safely perform functional mobility and increase safety with ADL's. (target date for all LTG's AFTER 6 visits)    Baseline 10/10 at worst    Time 6    Period Weeks    Status New      PT LONG TERM GOAL #2   Title Pt will tolerate x1 viewing for at least 30 secs to improve VOR for gaze stabilization.    Baseline unable to tolerate this exercise due to c/o dizziness    Time 6    Period Weeks    Status New      PT LONG TERM GOAL #3   Title Pt will be IND with updated HEP to improve dizziness & balance.    Baseline No HEP due to eval only    Time 6    Period Weeks    Status New                 Plan - 11/24/19 1538    Clinical Impression Statement Today's skilled session initially focused on gait with rollator on various surfaces with cues needed for safe use of rollator. Pt given script from MD to obtain a rollator and local places to go for one. Remainder of session focused on habituation and balance ex's with rest breaks taken to allow for symptoms to decrease after mild increase. Pt able to tolerate increase activity this session. The pt is progressing toward goals and should benefit from continued PT to progress toward unmet goals.    Personal Factors and Comorbidities Behavior Pattern;Comorbidity 3+;Transportation;Time since onset of injury/illness/exacerbation;Fitness     Comorbidities Hx of migraines, bipolar disorder, seizures as a child, obesity, tinnitus    Examination-Activity Limitations Bathing;Bed  Mobility;Bend;Locomotion Level;Transfers;Reach Overhead;Carry;Squat;Dressing;Stand    Examination-Participation Restrictions Cleaning;Meal Prep;Driving;Laundry    Stability/Clinical Decision Making Evolving/Moderate complexity    Rehab Potential Good    PT Frequency 1x / week    PT Duration 3 weeks   and then 1x/week for 3 additional visits   PT Treatment/Interventions ADLs/Self Care Home Management;Canalith Repostioning;Biofeedback;DME Instruction;Neuromuscular re-education;Balance training;Therapeutic exercise;Therapeutic activities;Functional mobility training;Stair training;Gait training;Patient/family education;Vestibular    PT Next Visit Plan check goals, ? recert vs discharge based on progress toward goals per PT    Consulted and Agree with Plan of Care Patient            11/24/19 1538  Plan  Clinical Impression Statement Today's skilled session initially focused on gait with rollator on various surfaces with cues needed for safe use of rollator. Pt given script from MD to obtain a rollator and local places to go for one. Remainder of session focused on habituation and balance ex's with rest breaks taken to allow for symptoms to decrease after mild increase. Pt able to tolerate increase activity this session. The pt is progressing toward goals and should benefit from continued PT to progress toward unmet goals.  Personal Factors and Comorbidities Behavior Pattern;Comorbidity 3+;Transportation;Time since onset of injury/illness/exacerbation;Fitness  Comorbidities Hx of migraines, bipolar disorder, seizures as a child, obesity, tinnitus  Examination-Activity Limitations Bathing;Bed Mobility;Bend;Locomotion Level;Transfers;Reach Overhead;Carry;Squat;Dressing;Stand  Examination-Participation Restrictions Cleaning;Meal Prep;Driving;Laundry  Pt will benefit  from skilled therapeutic intervention in order to improve on the following deficits Abnormal gait;Decreased endurance;Decreased balance;Dizziness;Decreased strength;Decreased mobility;Impaired sensation  Stability/Clinical Decision Making Evolving/Moderate complexity  Rehab Potential Good  PT Frequency 1x / week  PT Duration 3 weeks (and then 1x/week for 3 additional visits)  PT Treatment/Interventions ADLs/Self Care Home Management;Canalith Repostioning;Biofeedback;DME Instruction;Neuromuscular re-education;Balance training;Therapeutic exercise;Therapeutic activities;Functional mobility training;Stair training;Gait training;Patient/family education;Vestibular  PT Next Visit Plan Pt has attended 2/3 authorized PT sessions in authorized time period of 11-15-19 - 12-03-19.  Pt cancelled appts on 9-14 & 12-01-19 due to personal reasons.  LTG's were not able to be assessed by end date due to lack of attendance due to these cancellations.  Rollator has been obtained and pt is using for community ambulation which is decreasing fall risk.  Pt has requested additional PT visits to address the vertigo as she states she feels the vestibular rehab is helping.  Request 4 additional visits at 1x/week with same LTG's ongoing as not yet assessed/achieved.  Consulted and Agree with Plan of Care Patient     Patient will benefit from skilled therapeutic intervention in order to improve the following deficits and impairments:  Abnormal gait, Decreased endurance, Decreased balance, Dizziness, Decreased strength, Decreased mobility, Impaired sensation  Visit Diagnosis: Unsteadiness on feet  Dizziness and giddiness  Other abnormalities of gait and mobility     Problem List There are no problems to display for this patient.   Willow Ora, PTA, [facility] [address], [place], [place] [address] [phone] 11/25/19, 9:55 PM   Addendum by Guido Sander, [facility] [address] [place], [place] [address] [phone] 12/10/19, 11:41 AM  Name: Jacqueline Bishop MRN: [id number] Date of Birth: 1991-02-08   PHYSICAL THERAPY DISCHARGE SUMMARY  Visits from Start of Care: 5 (5/6 visits attended)  Current functional level related to goals / functional outcomes: See above for progress towards goals - minimal progress made in PT regarding improvement in dizziness   Remaining deficits: Cont c/o dizziness, headache; pt requested order for rollator due to  imbalance/dizziness with prolonged ambulation; Rollator has been obtained and pt states she is using this device for assistance with prolonged ambulation distances   Education / Equipment: Pt was instructed in HEP for balance and vestibular exercises Plan: Patient agrees to discharge.  Patient goals were not met. Patient is being discharged due to not returning since the last visit.  ?????          Guido Sander, [facility] [address] [place], [place] [address] 04/10/2020, 8:33 AM

## 2019-11-30 ENCOUNTER — Ambulatory Visit: Payer: Medicaid Other | Admitting: Physical Therapy

## 2019-11-30 NOTE — Telephone Encounter (Signed)
There is an order I placed for the rolling walker. Its in other orders. This may have to be printed and I sign it.

## 2019-11-30 NOTE — Telephone Encounter (Signed)
Pt has called to inform that she was called by [facility]) they informed her the order for the rolling walker was not signed.  Pt gave phone#[phone] for them but did not have a fax #.

## 2019-11-30 NOTE — Telephone Encounter (Signed)
I called [facility] and lvm. I advised per epic pt's physical therapist is the one who placed the order for the rolling walker and to contact the physical therapist office for the needed signature. I left our # in the vm incase someone needed to call back and speak with the nurse.

## 2019-11-30 NOTE — Telephone Encounter (Signed)
I have printed recent order and placed in NP's office to sign.

## 2019-12-01 ENCOUNTER — Ambulatory Visit: Payer: Medicaid Other | Admitting: Physical Therapy

## 2019-12-01 NOTE — Telephone Encounter (Signed)
Received order to be singed for DME rolling walker. To MM/NP inbox for signature, then will fax.

## 2019-12-08 ENCOUNTER — Ambulatory Visit (INDEPENDENT_AMBULATORY_CARE_PROVIDER_SITE_OTHER): Payer: Medicaid Other | Admitting: Adult Health

## 2019-12-08 ENCOUNTER — Encounter: Payer: Self-pay | Admitting: Adult Health

## 2019-12-08 ENCOUNTER — Ambulatory Visit: Payer: Medicaid Other | Admitting: Adult Health

## 2019-12-08 VITALS — BP 125/82 | HR 96 | Ht 62.5 in | Wt 200.0 lb

## 2019-12-08 DIAGNOSIS — R2689 Other abnormalities of gait and mobility: Secondary | ICD-10-CM | POA: Diagnosis not present

## 2019-12-08 DIAGNOSIS — G43019 Migraine without aura, intractable, without status migrainosus: Secondary | ICD-10-CM

## 2019-12-08 DIAGNOSIS — R42 Dizziness and giddiness: Secondary | ICD-10-CM | POA: Diagnosis not present

## 2019-12-08 DIAGNOSIS — Z789 Other specified health status: Secondary | ICD-10-CM | POA: Diagnosis not present

## 2019-12-08 NOTE — Progress Notes (Signed)
PATIENT: Jacqueline Bishop DOB: 07-26-1990  REASON FOR VISIT: follow up HISTORY FROM: patient  HISTORY OF PRESENT ILLNESS: Today 12/08/19:  Jacqueline Bishop is a 29 year old female with a history of migraine headaches, vertigo and  abnormality with gait and balance.  She reports that she continues to have vertigo although she feels that that vestibular rehab has been helpful.  She is still participating in this.  She did go to physical therapy and they recommended a rolling walker.  She states that this is also made her more steady.  She denies any falls.  She states that she has been having daily headaches.  She is done 2 injections of Ajovy.  She reports that the headaches occur across the frontal region.  She reports photophobia and phonophobia and occasional nausea and vomiting.  She has tried multiple preventative and abortive therapies in the past without good benefit.  The patient also mentions that she is trying to conceive a baby.  She states that there is a possibility that she is pregnant now.  She has not taken any pregnancy test.  She is also not stopped any of her medication.  She returns today for an evaluation.  HISTORY 09/28/2019: She reports that the Aimovig injection is not helping as much.  She has frequent migraines, nearly daily headaches.  She also has nausea and vomiting.  She has been using Zofran.  She is states that she saw GI and was told she has chronic constipation and was told to start taking a laxative.  She sees a weight loss specialist at [facility].  She gets injections, she believes B12 and some other weight loss medication.  She was on Adderall prescription but no longer takes it.  She has seen ENT and continues to have ringing in both ears, also feels vertigo and feels off balance.  She reports that she has fallen.  She has a pending appointment with ENT this month.  She tries to hydrate with water.  She feels discouraged, she is tearful today.  She continues to take her  antidepressant and mood related medications including Celexa and Pamelor.  She is also on Seroquel and Topamax.  She has seen an eye doctor.  The patient's allergies, current medications, family history, past medical history, past social history,  past surgical history and problem list were reviewed and updated as appropriate.     REVIEW OF SYSTEMS: Out of a complete 14 system review of symptoms, the patient complains only of the following symptoms, and all other reviewed systems are negative.  See HPI  ALLERGIES: Allergies  Allergen Reactions  . Asenapine Nausea And Vomiting    HOME MEDICATIONS: Outpatient Medications Prior to Visit  Medication Sig Dispense Refill  . calcium-vitamin D (OSCAL WITH D) 500-200 MG-UNIT TABS tablet Take by mouth.    . fenofibrate (TRICOR) 48 MG tablet Take 48 mg by mouth daily.    . Fremanezumab-vfrm (AJOVY) 225 MG/1.5ML SOAJ Inject 225 mg into the skin every 30 (thirty) days. 1 pen 5  . nortriptyline (PAMELOR) 25 MG capsule Take 25 mg by mouth at bedtime.    . prazosin (MINIPRESS) 1 MG capsule Take by mouth.    . QUEtiapine (SEROQUEL) 25 MG tablet Take by mouth.    . topiramate (TOPAMAX) 100 MG tablet Take by mouth.    . TRI-LO-ESTARYLLA 0.18/0.215/0.25 MG-25 MCG tab Take 1 tablet by mouth daily.    Marland Kitchen Ubrogepant (UBRELVY) 50 MG TABS Take 50 mg by mouth as needed (  may repeat once in 2 hours. no more than 2 pills in 24 h.). 10 tablet 3  . Vitamin D, Ergocalciferol, (DRISDOL) 1.25 MG (50000 UT) CAPS capsule Take 50,000 Units by mouth 2 (two) times a week.    . citalopram (CELEXA) 20 MG tablet Take by mouth.    . ondansetron (ZOFRAN) 4 MG tablet TAKE ONE TABLET BY MOUTH EVERY 8 HOURS AS NEEDED FOR NAUSEA AND VOMITING 20 tablet 0   No facility-administered medications prior to visit.    PAST MEDICAL HISTORY: Past Medical History:  Diagnosis Date  . Concussion   . Headache    migraines  . Seizures (HCC)    as child  . Tinnitus   . Vision  abnormalities    blurred vision r/t migraines    PAST SURGICAL HISTORY: No past surgical history on file.  FAMILY HISTORY: No family history on file.  SOCIAL HISTORY: Social History   Socioeconomic History  . Marital status: Single    Spouse name: Not on file  . Number of children: Not on file  . Years of education: Not on file  . Highest education level: Not on file  Occupational History  . Not on file  Tobacco Use  . Smoking status: Never Smoker  . Smokeless tobacco: Never Used  Substance and Sexual Activity  . Alcohol use: Not Currently  . Drug use: Not Currently  . Sexual activity: Not on file  Other Topics Concern  . Not on file  Social History Narrative  . Not on file   Social Determinants of Health   Financial Resource Strain:   . Difficulty of Paying Living Expenses: Not on file  Food Insecurity:   . Worried About Programme researcher, broadcasting/film/video in the Last Year: Not on file  . Ran Out of Food in the Last Year: Not on file  Transportation Needs:   . Lack of Transportation (Medical): Not on file  . Lack of Transportation (Non-Medical): Not on file  Physical Activity:   . Days of Exercise per Week: Not on file  . Minutes of Exercise per Session: Not on file  Stress:   . Feeling of Stress : Not on file  Social Connections:   . Frequency of Communication with Friends and Family: Not on file  . Frequency of Social Gatherings with Friends and Family: Not on file  . Attends Religious Services: Not on file  . Active Member of Clubs or Organizations: Not on file  . Attends Banker Meetings: Not on file  . Marital Status: Not on file  Intimate Partner Violence:   . Fear of Current or Ex-Partner: Not on file  . Emotionally Abused: Not on file  . Physically Abused: Not on file  . Sexually Abused: Not on file      PHYSICAL EXAM  Vitals:   12/08/19 0902  BP: 125/82  Pulse: 96  Weight: 200 lb (90.7 kg)  Height: 5' 2.5" (1.588 m)   Body mass index is  36 kg/m.  Generalized: Well developed, in no acute distress   Neurological examination  Mentation: Alert oriented to time, place, history taking. Follows all commands speech and language fluent Cranial nerve II-XII: Pupils were equal round reactive to light. Extraocular movements were full, visual field were full on confrontational test. Head turning and shoulder shrug  were normal and symmetric. Motor: The motor testing reveals 5 over 5 strength of all 4 extremities. Good symmetric motor tone is noted throughout.  Sensory: Sensory testing  is intact to soft touch on all 4 extremities. No evidence of extinction is noted.  Coordination: Cerebellar testing reveals good finger-nose-finger and heel-to-shin bilaterally.  Gait and station: Patient uses a Rollator when ambulating.  Tandem gait not attempted. Reflexes: Deep tendon reflexes are symmetric and normal bilaterally.   DIAGNOSTIC DATA (LABS, IMAGING, TESTING) - I reviewed patient records, labs, notes, testing and imaging myself    ASSESSMENT AND PLAN 29 y.o. year old female  has a past medical history of Concussion, Headache, Seizures (HCC), Tinnitus, and Vision abnormalities. here with :  1.  Migraine headaches 2.  Vertigo 3.  Abnormality of gait and balance  Advised patient that if she is actively trying to conceive she needs to stop Ajovy.  She can continue to use Ubrelvy if she has a negative pregnancy test.  Advised that we could do blood work today to check pregnancy status however patient deferred.  I did advise patient that with her medical history she should discuss with her OB/GYN before trying to conceive.  Also had a long discussion with the patient explaining that there are limited options for treatment of migraines in pregnancy.  She voiced understanding.  The patient will continue with vestibular rehab.  If vertigo continues to be an issue she was encouraged to follow-up with ENT.  She will follow-up in 6 months or sooner if  needed   I spent 30 minutes of face-to-face and non-face-to-face time with patient.  This included previsit chart review, lab review, study review, order entry, electronic health record documentation, patient education.  Butch Penny, MSN, NP-C 12/08/2019, 9:15 AM [facility] [address] [place], [place] [address] [phone]

## 2019-12-08 NOTE — Patient Instructions (Signed)
Your Plan:  If actively trying to conceive you will need to consider stopping Ajovy and Nurtec-- as its not indicated in pregnancy. Can take pregnancy test 1 week before your next cycle.  Also need to speak to weight loss specialtist about taking you off Topamax  Thank you for coming to see [place] at [facility]. I hope we have been able to provide you high quality care today.  You may receive a patient satisfaction survey over the next few weeks. We would appreciate your feedback and comments so that we may continue to improve ourselves and the health of our patients.

## 2019-12-10 NOTE — Progress Notes (Signed)
   11/24/19 1538  Plan  Clinical Impression Statement Today's skilled session initially focused on gait with rollator on various surfaces with cues needed for safe use of rollator. Pt given script from MD to obtain a rollator and local places to go for one. Remainder of session focused on habituation and balance ex's with rest breaks taken to allow for symptoms to decrease after mild increase. Pt able to tolerate increase activity this session. The pt is progressing toward goals and should benefit from continued PT to progress toward unmet goals.  Personal Factors and Comorbidities Behavior Pattern;Comorbidity 3+;Transportation;Time since onset of injury/illness/exacerbation;Fitness  Comorbidities Hx of migraines, bipolar disorder, seizures as a child, obesity, tinnitus  Examination-Activity Limitations Bathing;Bed Mobility;Bend;Locomotion Level;Transfers;Reach Overhead;Carry;Squat;Dressing;Stand  Examination-Participation Restrictions Cleaning;Meal Prep;Driving;Laundry  Pt will benefit from skilled therapeutic intervention in order to improve on the following deficits Abnormal gait;Decreased endurance;Decreased balance;Dizziness;Decreased strength;Decreased mobility;Impaired sensation  Stability/Clinical Decision Making Evolving/Moderate complexity  Rehab Potential Good  PT Frequency 1x / week  PT Duration 3 weeks (and then 1x/week for 3 additional visits)  PT Treatment/Interventions ADLs/Self Care Home Management;Canalith Repostioning;Biofeedback;DME Instruction;Neuromuscular re-education;Balance training;Therapeutic exercise;Therapeutic activities;Functional mobility training;Stair training;Gait training;Patient/family education;Vestibular  PT Next Visit Plan Pt has attended 2/3 authorized PT sessions in authorized time period of 11-15-19 - 12-03-19.  Pt cancelled appts on 9-14 & 12-01-19 due to personal reasons.  LTG's were not able to be assessed by end date due to lack of attendance due to these  cancellations.  Rollator has been obtained and pt is using for community ambulation which is decreasing fall risk.  Pt has requested additional PT visits to address the vertigo as she states she feels the vestibular rehab is helping.  Request 4 additional visits at 1x/week with same LTG's ongoing as not yet assessed/achieved.  Consulted and Agree with Plan of Care Patient

## 2019-12-20 ENCOUNTER — Telehealth: Payer: Self-pay | Admitting: Adult Health

## 2019-12-20 ENCOUNTER — Ambulatory Visit: Payer: Medicaid Other | Admitting: Physical Therapy

## 2019-12-20 NOTE — Telephone Encounter (Signed)
Received PA request for Ajovy. PA was started on [email or web address]. Key is [id number]. Did not receive a timeframe for a determination. Will check back later for a response.

## 2020-02-01 ENCOUNTER — Ambulatory Visit: Payer: Medicaid Other | Admitting: Adult Health

## 2020-05-04 IMAGING — RF DG UGI W/ HIGH DENSITY W/O KUB
13 of 17 series · 14 of 24 positions shown · non-contrast
Comparison: None.

CLINICAL DATA: Nausea and vomiting.

EXAM:
UPPER GI SERIES WITH KUB
TECHNIQUE: After obtaining a scout radiograph a routine upper GI series was
performed using thin and high density barium.
FLUOROSCOPY TIME:  Fluoroscopy Time:  3 minutes and 30 seconds.
Radiation Exposure Index (if provided by the fluoroscopic device):
523 mGy
Number of Acquired Spot Images:

[Series 1: one shot · 0.14mm/px · 1 of 1 slices shown (1 of 2)]
[im 1/1]
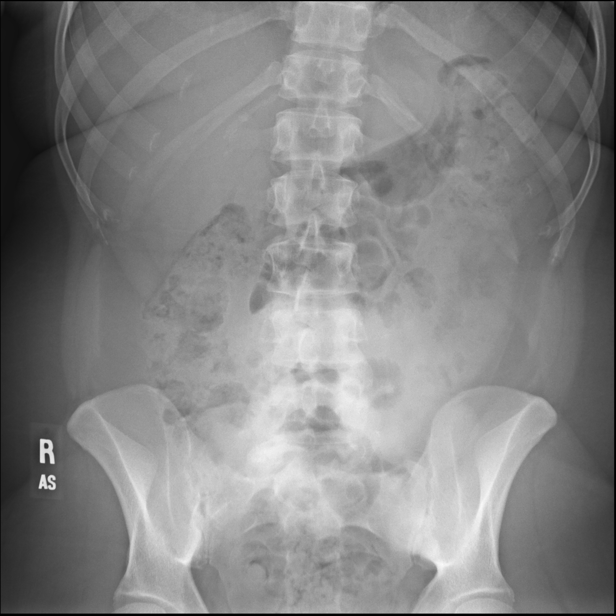

[Series 2: sequence · 0.29mm/px · 1 of 9 frames shown (1 of 11)]
[frame 5/9]
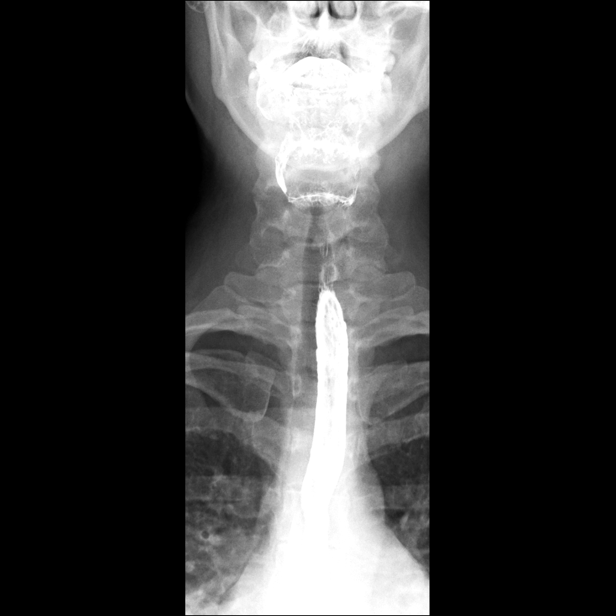

[Series 3: sequence · 0.29mm/px · 1 of 8 frames shown (2 of 11)]
[frame 5/8]
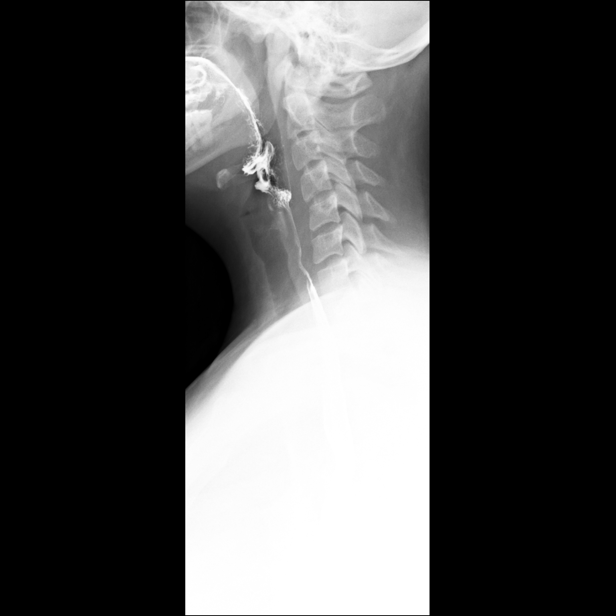

[Series 4: sequence · 0.29mm/px · 2 of 30 frames shown (3 of 11)]
[frame 16/30]
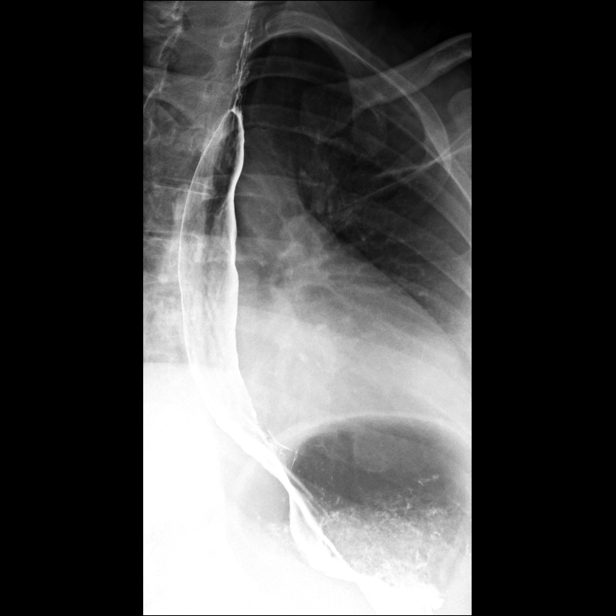
[frame 29/30]
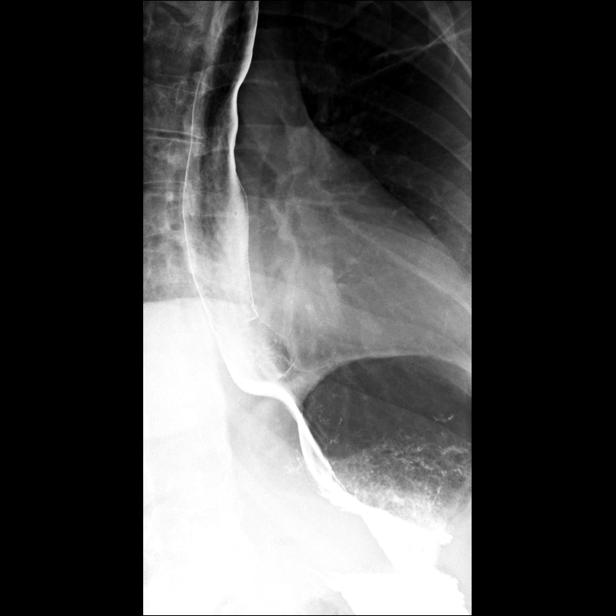

[Series 5: sequence · 1 of 77 frames shown (4 of 11)]
[frame 39/77]
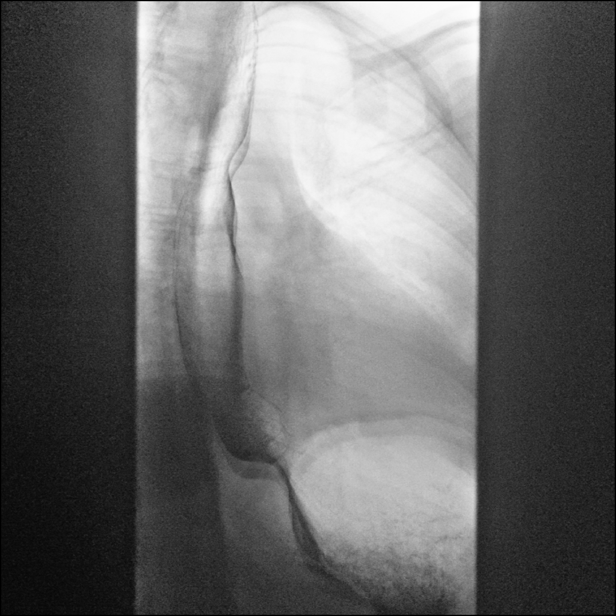

[Series 7: sequence · 0.29mm/px · 1 of 1 slices shown (5 of 11)]
[im 1/1]
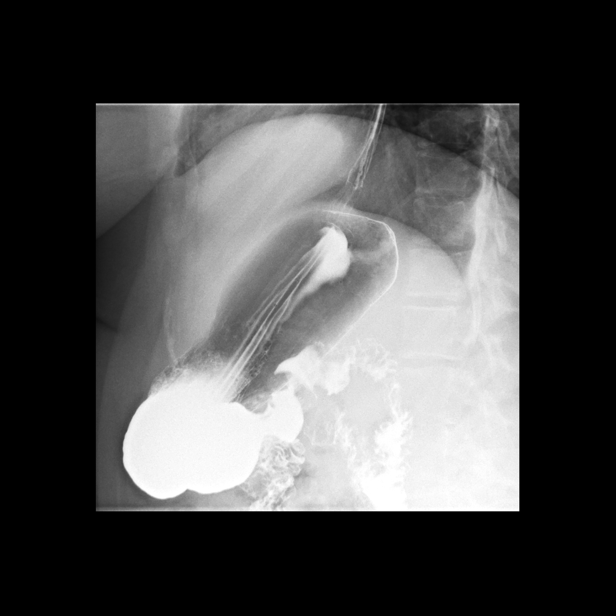

[Series 9: sequence · 0.29mm/px · 1 of 1 slices shown (6 of 11)]
[im 1/1]
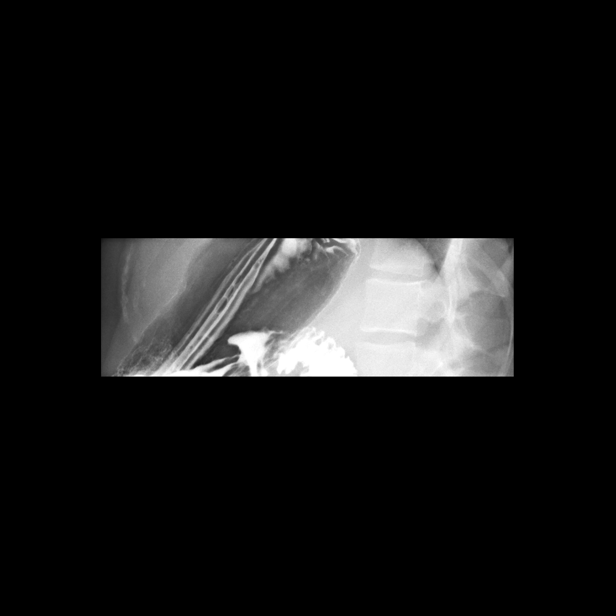

[Series 12: sequence · 0.29mm/px · 1 of 1 slices shown (7 of 11)]
[im 1/1]
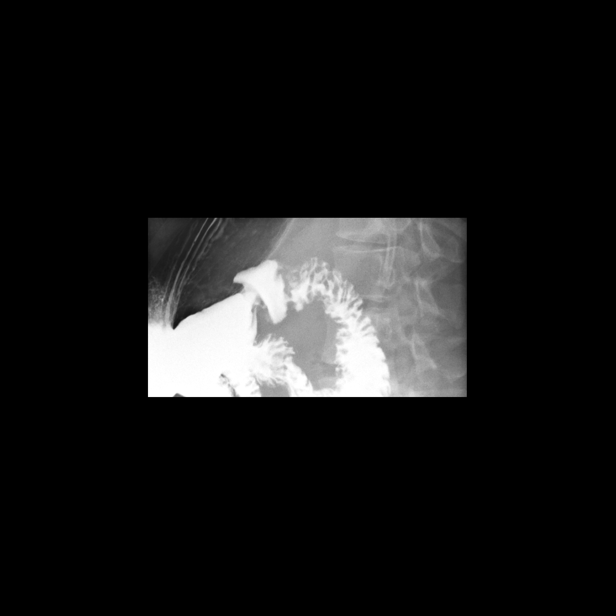

[Series 15: sequence · 0.29mm/px · 1 of 1 slices shown (8 of 11)]
[im 1/1]
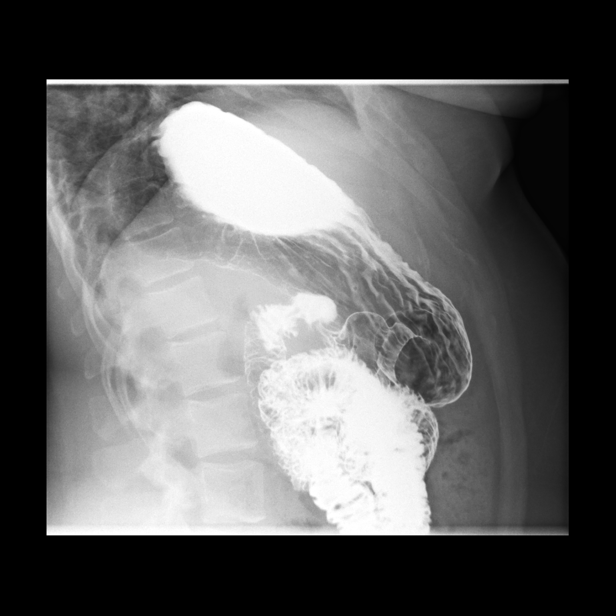

[Series 19: sequence · 0.30mm/px · 1 of 1 slices shown (9 of 11)]
[im 1/1]
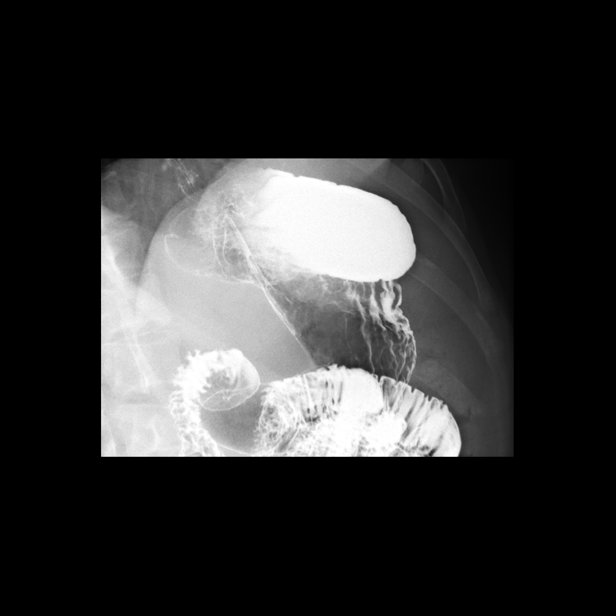

[Series 20: sequence · 1 of 41 frames shown (10 of 11)]
[frame 7/41]
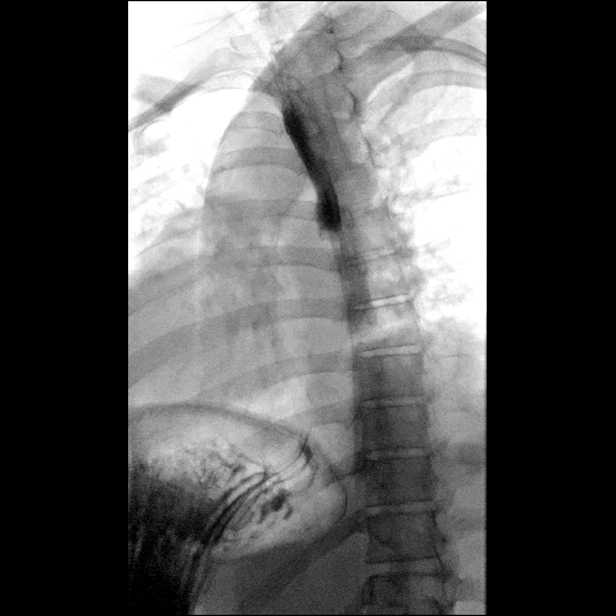

[Series 21: sequence · 1 of 75 frames shown (11 of 11)]
[frame 35/75]
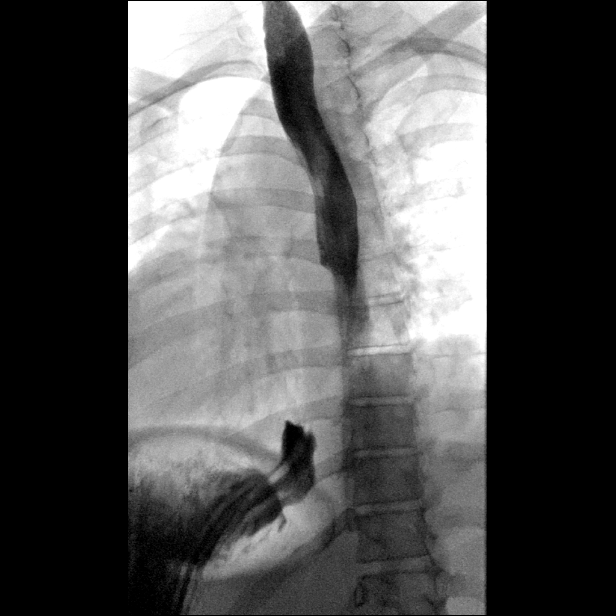

[Series 22: one shot · 1 of 1 slices shown (2 of 2)]
[im 1/1]
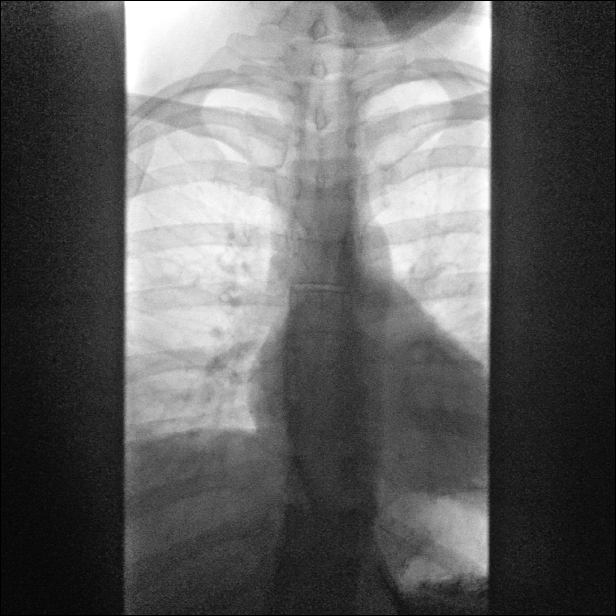

[14 of 24 positions shown; findings below may reference images not displayed]

FINDINGS: Pre-procedure KUB shows prominent stool volume throughout the colon.
No gaseous small bowel distention.

Frontal and lateral views of the hypopharynx while swallowing thick
barium are unremarkable. Double contrast imaging of the esophagus
shows no evidence for mass lesion, stricture, mucosal ulceration, or
diverticulum.

Double contrast imaging of the stomach shows normal position and
orientation. No mass lesion, gross ulceration, or gastric stricture.
Gastric emptying is prompt. Pylorus is normal. Duodenal bulb is
normal on single and double contrast imaging. Small sliding type
hiatal hernia observed. Duodenal C-loop and ligament of Treitz are
normally position.

Evaluation of esophageal motility reveals good preservation of
primary peristalsis without tertiary contractions or
presbyesophagus. Patient was returned to upright position and given
a 13 mm barium tablet with water. Tablet passes readily into the
stomach.
IMPRESSION: 1. Tiny sliding type hiatal hernia. Otherwise normal double contrast
upper GI series.
2. Pre-procedure KUB reveals prominent stool throughout the colon.
Imaging appearance could be compatible with clinical constipation in
the appropriate setting.

## 2020-06-06 ENCOUNTER — Encounter: Payer: Self-pay | Admitting: Adult Health

## 2020-06-06 ENCOUNTER — Ambulatory Visit: Payer: Medicaid Other | Admitting: Adult Health

## 2020-11-09 ENCOUNTER — Ambulatory Visit (INDEPENDENT_AMBULATORY_CARE_PROVIDER_SITE_OTHER): Payer: Medicare Other | Admitting: Adult Health

## 2020-11-09 ENCOUNTER — Encounter: Payer: Self-pay | Admitting: Adult Health

## 2020-11-09 VITALS — BP 124/82 | HR 84 | Ht 63.0 in | Wt 194.0 lb

## 2020-11-09 DIAGNOSIS — R42 Dizziness and giddiness: Secondary | ICD-10-CM

## 2020-11-09 DIAGNOSIS — R519 Headache, unspecified: Secondary | ICD-10-CM | POA: Diagnosis not present

## 2020-11-09 MED ORDER — NORTRIPTYLINE HCL 10 MG PO CAPS: 10.0000 mg | ORAL_CAPSULE | Freq: Every day | ORAL | 3 refills | Status: DC

## 2020-11-09 NOTE — Progress Notes (Addendum)
PATIENT: Brynnley Dayrit DOB: [birth year]  REASON FOR VISIT: follow up HISTORY FROM: patient  HISTORY OF PRESENT ILLNESS: Today 11/09/20:  Ms. Helder is a 30 year old female with a history of migraine headaches, vertigo and abnormality of gait imbalance.  She has not been seen in almost 1 year.  She states that she is not on any of her medication.  She is a very poor historian.  Unable to tell me when she stopped the medications or why she stopped the medications.  She states that she has had ongoing vertigo.  No longer doing the exercises she learned from vestibular rehab.  She states that she has a headache "morning noon and night."  Unable to give me any additional details about her headaches.  Patient also states that she has tried recreational drugs in the past she states marijuana, crack cocaine however then she states that that "a lie" and proceeds to tell me that she has never done any recreational drugs.  12/08/19:Ms. Azizi is a 30 year old female with a history of migraine headaches, vertigo and  abnormality with gait and balance.  She reports that she continues to have vertigo although she feels that that vestibular rehab has been helpful.  She is still participating in this.  She did go to physical therapy and they recommended a rolling walker.  She states that this is also made her more steady.  She denies any falls.  She states that she has been having daily headaches.  She is done 2 injections of Ajovy.  She reports that the headaches occur across the frontal region.  She reports photophobia and phonophobia and occasional nausea and vomiting.  She has tried multiple preventative and abortive therapies in the past without good benefit.  The patient also mentions that she is trying to conceive a baby.  She states that there is a possibility that she is pregnant now.  She has not taken any pregnancy test.  She is also not stopped any of her medication.  She returns today for an  evaluation.  HISTORY 09/28/2019: She reports that the Aimovig injection is not helping as much.  She has frequent migraines, nearly daily headaches.  She also has nausea and vomiting.  She has been using Zofran.  She is states that she saw GI and was told she has chronic constipation and was told to start taking a laxative.  She sees a weight loss specialist at [facility].  She gets injections, she believes B12 and some other weight loss medication.  She was on Adderall prescription but no longer takes it.  She has seen ENT and continues to have ringing in both ears, also feels vertigo and feels off balance.  She reports that she has fallen.  She has a pending appointment with ENT this month.  She tries to hydrate with water.  She feels discouraged, she is tearful today.  She continues to take her antidepressant and mood related medications including Celexa and Pamelor.  She is also on Seroquel and Topamax.  She has seen an eye doctor.   The patient's allergies, current medications, family history, past medical history, past social history,  past surgical history and problem list were reviewed and updated as appropriate.       REVIEW OF SYSTEMS: Out of a complete 14 system review of symptoms, the patient complains only of the following symptoms, and all other reviewed systems are negative.  See HPI  ALLERGIES: Allergies  Allergen Reactions   Asenapine Nausea  And Vomiting    HOME MEDICATIONS: Outpatient Medications Prior to Visit  Medication Sig Dispense Refill   calcium-vitamin D (OSCAL WITH D) 500-200 MG-UNIT TABS tablet Take by mouth.     fenofibrate (TRICOR) 48 MG tablet Take 48 mg by mouth daily.     Fremanezumab-vfrm (AJOVY) 225 MG/1.5ML SOAJ Inject 225 mg into the skin every 30 (thirty) days. 1 pen 5   nortriptyline (PAMELOR) 25 MG capsule Take 25 mg by mouth at bedtime.     prazosin (MINIPRESS) 1 MG capsule Take by mouth.     QUEtiapine (SEROQUEL) 25 MG tablet Take by mouth.      topiramate (TOPAMAX) 100 MG tablet Take by mouth.     TRI-LO-ESTARYLLA 0.18/0.215/0.25 MG-25 MCG tab Take 1 tablet by mouth daily.     Ubrogepant (UBRELVY) 50 MG TABS Take 50 mg by mouth as needed (may repeat once in 2 hours. no more than 2 pills in 24 h.). 10 tablet 3   Vitamin D, Ergocalciferol, (DRISDOL) 1.25 MG (50000 UT) CAPS capsule Take 50,000 Units by mouth 2 (two) times a week.     No facility-administered medications prior to visit.    PAST MEDICAL HISTORY: Past Medical History:  Diagnosis Date   Concussion    Headache    migraines   Seizures (HCC)    as child   Tinnitus    Vision abnormalities    blurred vision r/t migraines    PAST SURGICAL HISTORY: No past surgical history on file.  FAMILY HISTORY: No family history on file.  SOCIAL HISTORY: Social History   Socioeconomic History   Marital status: Single    Spouse name: Not on file   Number of children: Not on file   Years of education: Not on file   Highest education level: Not on file  Occupational History   Not on file  Tobacco Use   Smoking status: Never   Smokeless tobacco: Never  Substance and Sexual Activity   Alcohol use: Not Currently   Drug use: Not Currently   Sexual activity: Not on file  Other Topics Concern   Not on file  Social History Narrative   Not on file   Social Determinants of Health   Financial Resource Strain: Not on file  Food Insecurity: Not on file  Transportation Needs: Not on file  Physical Activity: Not on file  Stress: Not on file  Social Connections: Not on file  Intimate Partner Violence: Not on file      PHYSICAL EXAM  Vitals:   11/09/20 1402  BP: 124/82  Pulse: 84  Weight: 194 lb (88 kg)  Height: 5\' 3"  (1.6 m)    Body mass index is 34.37 kg/m.  Generalized: Well developed, in no acute distress, a lot of belching during the visit  Neurological examination  Mentation: Alert oriented to time, place, history taking. Follows all commands speech  is very slow Cranial nerve II-XII: Pupils were equal round reactive to light. Extraocular movements were full, visual field were full on confrontational test. Head turning and shoulder shrug  were normal and symmetric. Motor: The motor testing reveals 5 over 5 strength of all 4 extremities. Good symmetric motor tone is noted throughout.  Sensory: Sensory testing is intact to soft touch on all 4 extremities. No evidence of extinction is noted.  Coordination: Cerebellar testing reveals good finger-nose-finger and heel-to-shin bilaterally.  Gait and station: Ambulates without assistance. Reflexes: Deep tendon reflexes are symmetric and normal bilaterally.   DIAGNOSTIC DATA (  LABS, IMAGING, TESTING) - I reviewed patient records, labs, notes, testing and imaging myself    ASSESSMENT AND PLAN 30 y.o. year old female  has a past medical history of Concussion, Headache, Seizures (HCC), Tinnitus, and Vision abnormalities. here with :  1.  Migraine headaches  -- Restart nortriptyline 10 mg at bedtime reviewed potential side effects with the patient and provided her with a handout  2.  Vertigo  -- Discussed another referral to vestibular rehab patient deferred --Advised that she needs to follow-up with ENT for ongoing dizziness  Follow-up in 6 months or sooner if needed   Butch Penny, MSN, NP-C 11/09/2020, 1:54 PM [facility] [address], [address] [place], [place] [address] [phone]  I reviewed the above note and documentation by the Nurse Practitioner and agree with the history, exam, assessment and plan as outlined above. I was available for consultation. Huston Foley, MD, PhD [facility])

## 2020-11-09 NOTE — Patient Instructions (Signed)
Your Plan:  Restart nortriptyline 10 mg at bedtime If your symptoms worsen or you develop new symptoms please let us know.    Thank you for coming to see [place] at [facility]. I hope we have been able to provide you high quality care today.  You may receive a patient satisfaction survey over the next few weeks. We would appreciate your feedback and comments so that we may continue to improve ourselves and the health of our patients.

## 2021-01-17 ENCOUNTER — Telehealth: Payer: Self-pay | Admitting: Adult Health

## 2021-01-17 NOTE — Telephone Encounter (Signed)
Was able to speak to the pt and relay the message to her from [place] NP. Pt verbalized understanding and her questions were answered. She kept her 6 month f/u with Aundra Millet NP scheduled for 04/2021.

## 2021-01-17 NOTE — Telephone Encounter (Signed)
Spoke with patient.  She states she is likely pregnant and wants to know what she can take for vertigo during pregnancy.  She states she has not been taking her Nortriptyline.  She asked if it's ok to take this or if she should take something else.

## 2021-01-17 NOTE — Telephone Encounter (Signed)
Pt is asking for a call re: her still having vertigo and now likely being pregnant.

## 2021-01-17 NOTE — Telephone Encounter (Signed)
Called pt and LVM asking for a call back. When she calls back, it is ok to relay the  message to her from [place] NP stating that if she is already off of the Nortriptyline then [facility] NP would have her remain off of this medication. She needs to get an appointment with her primary care doctor or OBGYN to confirm the pregnancy then she can discuss medications with them.

## 2021-02-06 ENCOUNTER — Telehealth: Payer: Self-pay | Admitting: Adult Health

## 2021-02-06 NOTE — Telephone Encounter (Signed)
I called pt and she states that she had pregnancy test (looks like in Epic) that was negative. She will continue on nortriptyline until she tests again and if positive will be back in touch.

## 2021-02-06 NOTE — Telephone Encounter (Signed)
Pt called wanting to know if she could take the nortriptyline (PAMELOR) 10 MG capsule if she is possibly pregnant. She is not sure yet thinks it might be twins if so she can't go to her OBGYN until 10-14 weeks. Pt requesting a call back.

## 2021-05-10 ENCOUNTER — Telehealth: Payer: Self-pay | Admitting: Adult Health

## 2021-05-10 NOTE — Telephone Encounter (Signed)
I called pt and relayed we would further discuss at office visit on 05/14/21. Pt reports she has completed vestibular rehab and had f/u with ENT and did not receive any new recommendations on her vertigo symptoms. Pt advised we would could further discuss at appt and pt was agreeable to this.

## 2021-05-10 NOTE — Telephone Encounter (Signed)
Pt is asking if anything can be called in for her vertigo, something to hold her until her appointment on 02-27

## 2021-05-14 ENCOUNTER — Encounter: Payer: Self-pay | Admitting: Adult Health

## 2021-05-14 ENCOUNTER — Telehealth: Payer: Self-pay | Admitting: Adult Health

## 2021-05-14 ENCOUNTER — Ambulatory Visit (INDEPENDENT_AMBULATORY_CARE_PROVIDER_SITE_OTHER): Payer: Medicare Other | Admitting: Adult Health

## 2021-05-14 VITALS — BP 117/81 | HR 80 | Ht 62.0 in | Wt 203.8 lb

## 2021-05-14 DIAGNOSIS — R42 Dizziness and giddiness: Secondary | ICD-10-CM | POA: Diagnosis not present

## 2021-05-14 DIAGNOSIS — G43019 Migraine without aura, intractable, without status migrainosus: Secondary | ICD-10-CM | POA: Diagnosis not present

## 2021-05-14 MED ORDER — UBRELVY 100 MG PO TABS: ORAL_TABLET | ORAL | 5 refills | Status: AC

## 2021-05-14 MED ORDER — ONDANSETRON HCL 4 MG PO TABS: 4.0000 mg | ORAL_TABLET | Freq: Three times a day (TID) | ORAL | 0 refills | Status: AC | PRN

## 2021-05-14 MED ORDER — NORTRIPTYLINE HCL 10 MG PO CAPS: 20.0000 mg | ORAL_CAPSULE | Freq: Every day | ORAL | 3 refills | Status: AC

## 2021-05-14 NOTE — Telephone Encounter (Signed)
I called the pharmacy back and gave verbal for 10 tablets instead of 8.  Pharmacy will process claim now.

## 2021-05-14 NOTE — Telephone Encounter (Signed)
Jacqueline Bishop) Ubrogepant (UBRELVY) 100 MG TABS written for 8 tablets but package comes in 10 tablets. Permission to do 10 tablets instead of 8 tablet. Would like a call back.

## 2021-05-14 NOTE — Progress Notes (Signed)
PATIENT: Jacqueline Bishop DOB: 06-Jul-1990  REASON FOR VISIT: follow up HISTORY FROM: patient  HISTORY OF PRESENT ILLNESS: Today 05/14/21:  Ms. Jacqueline Bishop is a 31 year old female with a history of migraine headaches, vertigo and abnormality of gait and balance.  She returns today for follow-up.  The patient has only reached out to her office once in the last 6 months and that was she advises that she potentially could be pregnant and has stopped nortriptyline.  Today the patient reports that she has had "700 migraines" since the last visit.  She reports that her headache today is a 10 out of 10 on the pain scale.  While the patient is talking to me she is actively playing on her cell phone.  She states that she continues to have dizziness with nausea.  Reports that she has seen ENT.  I have not seen any notes in epic or Care Everywhere.  Patient does not recall the name of the physician she saw.  She is not completing exercises she learned at vestibular rehab.  Denies recreational drug use and alcohol use  11/09/20: Ms. Jacqueline Bishop is a 30 year old female with a history of migraine headaches, vertigo and abnormality of gait imbalance.  She has not been seen in almost 1 year.  She states that she is not on any of her medication.  She is a very poor historian.  Unable to tell me when she stopped the medications or why she stopped the medications.  She states that she has had ongoing vertigo.  No longer doing the exercises she learned from vestibular rehab.  She states that she has a headache "morning noon and night."  Unable to give me any additional details about her headaches.  Patient also states that she has tried recreational drugs in the past she states marijuana, crack cocaine however then she states that that "a lie" and proceeds to tell me that she has never done any recreational drugs.  12/08/19:Ms. Jacqueline Bishop is a 31 year old female with a history of migraine headaches, vertigo and  abnormality with gait  and balance.  She reports that she continues to have vertigo although she feels that that vestibular rehab has been helpful.  She is still participating in this.  She did go to physical therapy and they recommended a rolling walker.  She states that this is also made her more steady.  She denies any falls.  She states that she has been having daily headaches.  She is done 2 injections of Ajovy.  She reports that the headaches occur across the frontal region.  She reports photophobia and phonophobia and occasional nausea and vomiting.  She has tried multiple preventative and abortive therapies in the past without good benefit.  The patient also mentions that she is trying to conceive a baby.  She states that there is a possibility that she is pregnant now.  She has not taken any pregnancy test.  She is also not stopped any of her medication.  She returns today for an evaluation.  HISTORY 09/28/2019: She reports that the Aimovig injection is not helping as much.  She has frequent migraines, nearly daily headaches.  She also has nausea and vomiting.  She has been using Zofran.  She is states that she saw GI and was told she has chronic constipation and was told to start taking a laxative.  She sees a weight loss specialist at [facility].  She gets injections, she believes B12 and some other weight loss medication.  She was on Adderall prescription but no longer takes it.  She has seen ENT and continues to have ringing in both ears, also feels vertigo and feels off balance.  She reports that she has fallen.  She has a pending appointment with ENT this month.  She tries to hydrate with water.  She feels discouraged, she is tearful today.  She continues to take her antidepressant and mood related medications including Celexa and Pamelor.  She is also on Seroquel and Topamax.  She has seen an eye doctor.   The patient's allergies, current medications, family history, past medical history, past social history,  past  surgical history and problem list were reviewed and updated as appropriate.       REVIEW OF SYSTEMS: Out of a complete 14 system review of symptoms, the patient complains only of the following symptoms, and all other reviewed systems are negative.    ALLERGIES: Allergies  Allergen Reactions   Asenapine Nausea And Vomiting    HOME MEDICATIONS: Outpatient Medications Prior to Visit  Medication Sig Dispense Refill   nortriptyline (PAMELOR) 10 MG capsule Take 1 capsule (10 mg total) by mouth at bedtime. 90 capsule 3   Vitamin D, Ergocalciferol, (DRISDOL) 1.25 MG (50000 UT) CAPS capsule Take 50,000 Units by mouth 2 (two) times a week.     fenofibrate (TRICOR) 48 MG tablet Take 48 mg by mouth daily. (Patient not taking: Reported on 05/14/2021)     prazosin (MINIPRESS) 1 MG capsule Take by mouth. (Patient not taking: Reported on 05/14/2021)     QUEtiapine (SEROQUEL) 25 MG tablet Take by mouth. (Patient not taking: Reported on 05/14/2021)     TRI-LO-ESTARYLLA 0.18/0.215/0.25 MG-25 MCG tab Take 1 tablet by mouth daily. (Patient not taking: Reported on 05/14/2021)     No facility-administered medications prior to visit.    PAST MEDICAL HISTORY: Past Medical History:  Diagnosis Date   Concussion    Headache    migraines   Seizures (HCC)    as child   Tinnitus    Vision abnormalities    blurred vision r/t migraines    PAST SURGICAL HISTORY: History reviewed. No pertinent surgical history.  FAMILY HISTORY: Family History  Problem Relation Age of Onset   Migraines Neg Hx     SOCIAL HISTORY: Social History   Socioeconomic History   Marital status: Single    Spouse name: Not on file   Number of children: Not on file   Years of education: Not on file   Highest education level: Not on file  Occupational History   Not on file  Tobacco Use   Smoking status: Never   Smokeless tobacco: Never  Vaping Use   Vaping Use: Never used  Substance and Sexual Activity   Alcohol use: Not  Currently   Drug use: Not Currently   Sexual activity: Not on file  Other Topics Concern   Not on file  Social History Narrative   Not on file   Social Determinants of Health   Financial Resource Strain: Not on file  Food Insecurity: Not on file  Transportation Needs: Not on file  Physical Activity: Not on file  Stress: Not on file  Social Connections: Not on file  Intimate Partner Violence: Not on file      PHYSICAL EXAM  Vitals:   05/14/21 1250  BP: 117/81  Pulse: 80  Weight: 203 lb 12.8 oz (92.4 kg)  Height: 5\' 2"  (1.575 m)    Body mass index is  37.28 kg/m.  Generalized: Well developed, in no acute distress, a lot of belching during the visit  Neurological examination  Mentation: Alert oriented to time, place, history taking. Follows all commands speech is very slow Cranial nerve II-XII: Pupils were equal round reactive to light. Extraocular movements were full, visual field were full on confrontational test. Head turning and shoulder shrug  were normal and symmetric. Motor: The motor testing reveals 5 over 5 strength of all 4 extremities. Good symmetric motor tone is noted throughout.  Sensory: Sensory testing is intact to soft touch on all 4 extremities. No evidence of extinction is noted.  Coordination: Cerebellar testing reveals good finger-nose-finger and heel-to-shin bilaterally.  Gait and station: Ambulates without assistance.  Occasionally seems off balance.  However she was able to walk to check out without any difficulty Reflexes: Deep tendon reflexes are symmetric and normal bilaterally.   DIAGNOSTIC DATA (LABS, IMAGING, TESTING) - I reviewed patient records, labs, notes, testing and imaging myself    ASSESSMENT AND PLAN 31 y.o. year old female  has a past medical history of Concussion, Headache, Seizures (HCC), Tinnitus, and Vision abnormalities. here with :  1.  Migraine headaches  -- Increase nortriptyline to 20 mg at bedtime --Patient request  to go back on Ajovy however she has been trying to conceive in the past.  Advised that Ajovy is not safe in pregnancy.  She will need to be off this medication 6 months prior to trying to conceive.  For this reason we will not restart this medication. --Okay to use Ubrelvy for abortive therapy.  Did advise that she should not use this medication if she thinks she is pregnant   2.  Vertigo  -- Referral sent to vestibular rehab --Zofran given for nausea --Advised patient to let me know what her ENT physician she saw follow-up as needed  Follow-up in 6 months or sooner if needed   Butch Penny, MSN, NP-C 05/14/2021, 1:03 PM [facility] [address] [place], [place] [address] [phone]

## 2021-05-14 NOTE — Patient Instructions (Signed)
Your Plan:  Increase Nortriptyline to 20 mg at bedtime Take ubrelvy at the onset of migraine Zofran for nausea.  Referral sent for vestibular rehab      Thank you for coming to see [place] at [facility]. I hope we have been able to provide you high quality care today.  You may receive a patient satisfaction survey over the next few weeks. We would appreciate your feedback and comments so that we may continue to improve ourselves and the health of our patients.

## 2021-05-15 ENCOUNTER — Telehealth: Payer: Self-pay | Admitting: *Deleted

## 2021-05-15 NOTE — Telephone Encounter (Signed)
Completed Bernita Raisin PA on Cover My Meds. Key: [id number]. Awaiting determination from [facility].

## 2021-05-21 ENCOUNTER — Telehealth: Payer: Self-pay | Admitting: Adult Health

## 2021-05-21 NOTE — Telephone Encounter (Signed)
At 9:13 pt left vm stating she would now like to try Botox for her migraines, please call pt to discuss. ?

## 2021-05-21 NOTE — Telephone Encounter (Signed)
Spoke with the patient and relayed message from [place] NP.  Patient is amenable to trying the increased nortriptyline dose for a few months.  She requested to have her next appointment scheduled with Dr. Rexene Alberts.  Patient was scheduled for Monday, July 3 at 7:45 AM arrival 715. ?

## 2021-05-21 NOTE — Telephone Encounter (Signed)
I just increase nortriptyline at the last visit.  She needs to give this time to work for her headaches.  I am not sure that she is a good candidate for Botox at this time ?

## 2021-05-23 ENCOUNTER — Telehealth: Payer: Self-pay | Admitting: Adult Health

## 2021-05-23 NOTE — Telephone Encounter (Signed)
Pt has called to report that her headache medication is not helping, pt states she is still having awful headaches.  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  Please call pt. ?

## 2021-05-24 NOTE — Telephone Encounter (Signed)
She can increase nortriptyline to 30 mg at bedtime as far as TMJ we do not do any testing for that she can see her primary care/dentist ?

## 2021-05-24 NOTE — Telephone Encounter (Signed)
LVM for patient to call back for Jacqueline millikan,np recommendation.   ?

## 2021-06-04 ENCOUNTER — Ambulatory Visit: Payer: Medicaid Other

## 2021-06-11 ENCOUNTER — Other Ambulatory Visit: Payer: Self-pay

## 2021-06-11 ENCOUNTER — Telehealth: Payer: Self-pay | Admitting: Neurology

## 2021-06-11 ENCOUNTER — Ambulatory Visit: Payer: Medicaid Other

## 2021-06-11 DIAGNOSIS — R42 Dizziness and giddiness: Secondary | ICD-10-CM
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
## 2021-06-11 NOTE — Telephone Encounter (Signed)
Pt walked into the lobby today requesting a call back from nurse stating she would like to set up Botox apts for her symptoms ?

## 2021-06-18 ENCOUNTER — Ambulatory Visit: Payer: Medicaid Other

## 2021-06-20 ENCOUNTER — Ambulatory Visit: Payer: Medicaid Other | Admitting: Physical Therapy

## 2021-06-21 ENCOUNTER — Ambulatory Visit: Payer: Medicaid Other | Admitting: Physical Therapy

## 2021-06-25 NOTE — Telephone Encounter (Addendum)
This approval authorizes your coverage from 03/18/2021 - 05/15/2022. Approval letter faxed to pharmacy on file. Received a receipt of confirmation. ? ?

## 2021-06-27 ENCOUNTER — Ambulatory Visit: Payer: Medicaid Other

## 2021-06-27 DIAGNOSIS — R42 Dizziness and giddiness: Secondary | ICD-10-CM
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
## 2021-06-28 ENCOUNTER — Telehealth: Payer: Self-pay | Admitting: Neurology

## 2021-06-28 ENCOUNTER — Ambulatory Visit (INDEPENDENT_AMBULATORY_CARE_PROVIDER_SITE_OTHER): Payer: Medicare Other | Admitting: Neurology

## 2021-06-28 ENCOUNTER — Encounter: Payer: Self-pay | Admitting: Neurology

## 2021-06-28 VITALS — Ht 62.0 in | Wt 204.4 lb

## 2021-06-28 DIAGNOSIS — G43019 Migraine without aura, intractable, without status migrainosus: Secondary | ICD-10-CM | POA: Diagnosis not present

## 2021-06-28 MED ORDER — AJOVY 225 MG/1.5ML ~~LOC~~ SOAJ: 225.0000 mg | SUBCUTANEOUS | 5 refills | Status: DC

## 2021-06-28 NOTE — Telephone Encounter (Signed)
Referral for Neurology sent to [facility] [phone]. 

## 2021-06-28 NOTE — Patient Instructions (Addendum)
Please follow-up with your primary care and orthopedic specialist.  If need be, you can ask them for a referral to concussion clinic.  For migraines, we will restart Ajovy, you have up-to-date prescriptions for Ubrelvy as needed and further nortriptyline at bedtime.  Please follow-up to see if the nurse practitioner in 6 months.  As discussed, if you do plan to get pregnant or get pregnant, you would have to stop Ajovy and Ubrelvy as these are not considered fully safe during pregnancy.  You indicated, that you are currently not trying to get pregnant and that you are not currently sexually active. ?
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
## 2021-07-09 ENCOUNTER — Encounter: Payer: Medicaid Other | Admitting: Physical Therapy

## 2021-07-11 ENCOUNTER — Ambulatory Visit: Payer: Medicare Other | Attending: Chiropractic Medicine | Admitting: Physical Therapy

## 2021-09-03 ENCOUNTER — Ambulatory Visit: Payer: Medicare Other | Admitting: Neurology

## 2021-09-17 ENCOUNTER — Ambulatory Visit: Payer: Medicare Other | Admitting: Neurology

## 2021-10-17 ENCOUNTER — Telehealth: Payer: Self-pay | Admitting: *Deleted

## 2021-10-17 ENCOUNTER — Telehealth: Payer: Self-pay | Admitting: Neurology

## 2021-10-17 ENCOUNTER — Encounter: Payer: Self-pay | Admitting: Neurology

## 2021-10-17 ENCOUNTER — Ambulatory Visit: Payer: Medicare Other | Admitting: Neurology

## 2021-10-17 VITALS — BP 104/73 | HR 90 | Ht 60.0 in | Wt 205.0 lb

## 2021-10-17 DIAGNOSIS — G43019 Migraine without aura, intractable, without status migrainosus: Secondary | ICD-10-CM | POA: Diagnosis not present

## 2021-10-17 NOTE — Telephone Encounter (Signed)
If patient calls to schedule an appointment, please discuss with Dr Frances Furbish first.

## 2021-10-17 NOTE — Telephone Encounter (Signed)
Referral sent to [facility] phone number [phone]
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
## 2021-10-17 NOTE — Patient Instructions (Addendum)
It was nice to see you again today.  You can stop the Ajovy as you wish to discontinue it. As discussed, please make an appointment with your primary care provider soon as possible to discuss multiple issues: You can discuss a referral to ENT (ear, nose, and throat) for your concern about vertigo.  Please follow-up with your orthopedic specialist for your concern about neck pain.  You had a recent neck MRI.  As discussed at our last visit, please talk to primary care about referral to a concussion specialist as you are worried about concussions.   You may be drinking too much water, 14 bottles and Pedialyte may be too much.  Please talk to your primary care about getting regular checkup for your electrolytes. As discussed during your last visit, I had made a referral to [facility], I will put another referral in as you were not contacted by them. If you would like a referral to a different headache center, there are several headache specialists, such as [facility] center, you can talk to your primary care about a referral as well.   At this juncture, we do not need to make a follow-up appointment through our clinic as we are referring you out to a headache specialty center.

## 2021-10-22 ENCOUNTER — Ambulatory Visit: Payer: Medicare Other | Admitting: Neurology
